# Patient Record
Sex: Female | Born: 1980 | Race: Black or African American | Hispanic: No | Marital: Single | Smoking: Former smoker
Health system: Southern US, Community
[De-identification: ages and names within clinical notes are randomized; demographics above are authoritative.]

## PROBLEM LIST (undated history)

## (undated) DIAGNOSIS — I10 Essential (primary) hypertension: Secondary | ICD-10-CM

## (undated) DIAGNOSIS — N760 Acute vaginitis: Secondary | ICD-10-CM

## (undated) DIAGNOSIS — B9689 Other specified bacterial agents as the cause of diseases classified elsewhere: Secondary | ICD-10-CM

## (undated) HISTORY — DX: Other specified bacterial agents as the cause of diseases classified elsewhere: N76.0

## (undated) HISTORY — PX: TOOTH EXTRACTION: SUR596

## (undated) HISTORY — PX: BREAST LUMPECTOMY: SHX2

## (undated) HISTORY — DX: Essential (primary) hypertension: I10

## (undated) HISTORY — DX: Other specified bacterial agents as the cause of diseases classified elsewhere: B96.89

---

## 2009-12-22 ENCOUNTER — Other Ambulatory Visit: Admission: RE | Admit: 2009-12-22 | Discharge: 2009-12-22 | Payer: Self-pay | Admitting: Obstetrics & Gynecology

## 2009-12-22 ENCOUNTER — Ambulatory Visit: Payer: Self-pay | Admitting: Obstetrics & Gynecology

## 2009-12-23 ENCOUNTER — Encounter: Payer: Self-pay | Admitting: Obstetrics & Gynecology

## 2009-12-23 LAB — CONVERTED CEMR LAB
HCV Ab: NEGATIVE
Hepatitis B Surface Ag: NEGATIVE
Trich, Wet Prep: NONE SEEN
WBC, Wet Prep HPF POC: NONE SEEN

## 2010-08-24 ENCOUNTER — Ambulatory Visit: Payer: Self-pay | Admitting: Obstetrics & Gynecology

## 2010-09-08 ENCOUNTER — Ambulatory Visit: Payer: Medicaid Other | Admitting: Obstetrics & Gynecology

## 2010-09-08 DIAGNOSIS — Z3041 Encounter for surveillance of contraceptive pills: Secondary | ICD-10-CM

## 2010-10-12 NOTE — Assessment & Plan Note (Signed)
Molly Smith, Molly Smith           ACCOUNT NO.:  [id number]   MEDICAL RECORD NO.:  [id number]          PATIENT TYPE:  POB   LOCATION:  [facility] at [place]         FACILITY:  [facility]   PHYSICIAN:  Jaynie Collins, MD     DATE OF BIRTH:  1981/01/15   DATE OF SERVICE:  12/22/2009                                  CLINIC NOTE   REASON FOR VISIT:  Annual examination.   A 30 year old gravida 2, para 1-0-1-1, who is here for her annual  examination.  The patient also reports that she has had midcycle  breakthrough bleeding for the past 3 months.  She is currently on Ortho  Tri-Cyclen Lo and has been on this for the last year and a half, but  just started having this breakthrough bleeding currently midcycle.  The  patient is not pregnant and has no other concerns.  She does have mild  amount of pain when she is having the breakthrough bleeding, but  otherwise no other complaints of pelvic pain.  The patient is sexually  active and does not have any problems.  Her last menstrual period was  November 23, 2009, and her last Pap smear was in January 2010.   PAST OB/GYN HISTORY:  The patient has had 1 vaginal delivery and 1  termination.  Her menstrual history is remarkable for menarche at age  27, usually regular periods on Ortho Tri-Cyclen Lo.  The patient has had  normal Pap smears and no sexually transmitted infections.  The patient  does report having recurrent episodes of bacterial vaginosis and does  take Flagyl as needed.   PAST MEDICAL HISTORY:  Recurrent bacterial vaginosis.   PAST SURGICAL HISTORY:  Left breast lumpectomy which was benign.   MEDICATIONS:  1. Ortho Tri-Cyclen Lo.  2. Flagyl 500 b.i.d. as needed.  3. Diflucan 150 mg p.r.n.   ALLERGIES:  PENICILLIN.   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  She  lives with her mother and daughter.  She smokes 1 pack of cigarettes per  week and has  smoked for 8 years.  She rarely drinks any alcohol.  She  denies any illicit drug use or intravenous drug use.  Denies any past or  current history of physical or sexual abuse.   FAMILY HISTORY:  Only remarkable for high blood pressure.  She denies  any gynecologic cancers, breast cancer, or colon cancer history.   REVIEW OF SYSTEMS:  Remarkable for a 10-pound weight gain, dysmenorrhea,  and vaginal discharge.   PHYSICAL EXAMINATION:  VITAL SIGNS:  Pulse is 88, blood pressure 119/78,  weight 148 pounds, height 66 inches.  GENERAL:  No apparent distress.  HEENT:  Normocephalic, atraumatic.  NECK:  Supple.  No masses.  Normal thyroid.  LUNGS:  Clear to auscultation bilaterally.  HEART:  Regular rate and rhythm.  BREASTS:  Symmetric in size, soft, nontender.  No abnormal masses, skin  changes, nipple drainage, or lymphadenopathy.  ABDOMEN:  Soft, nontender, nondistended.  No organomegaly.  EXTREMITIES:  No cyanosis, clubbing, or edema.  PELVIC:  Normal external female genitalia.  Pink, well-rugated vagina.  No lesions seen.  The patient does have small amount of blood in her  vaginal vault and is at the start of her menstrual period.  A sample of  her vaginal discharge was taken for wet prep and Pap smear was done.  Bimanual exam showed small mobile uterus, normal adnexa bilaterally.  No  tenderness on palpation.   ASSESSMENT AND PLAN:  The patient is a 30 year old gravida 2, para 1-0-1-  1, here for annual examination.  The patient also complains of  breakthrough bleeding in the midcycle for the past 3 months and is  currently on Ortho Tri-Cyclen Lo.  As for her preventative health  maintenance, she had a normal breast examination today and Pap smear is  pending.  We will follow up on the results.  The patient was interested  in sexually transmitted infection screen which was also done today and  the results will be communicated to the patient.  As for her  breakthrough bleeding  midcycle, the patient was told that this is  mittelschmerz phenomenon and this is midcycle ovulation pain which can  be also associated with some bleeding.  She was told that given that she  is on a triphasic pill and it is low triphasic pill that she could be  having breakthrough ovulation.  It is recommended that she goes on a  monophasic pill, the patient does want to remain on low pills, so she  was given Loestrin 24 Fe.  She was told that if she continues to have  breakthrough bleeding in this low-dose monophasic pill, she might have  to go to a regular 30 or 35 mcg monophasic pill and she agrees with  this.  She was given samples for 2 months of Loestrin 24.  We will see  what her response to this.  She was also given a script for Loestrin for  1 year.  The patient was told to call back if her symptoms continue or  for any other gynecologic symptoms.           ______________________________  Jaynie Collins, MD     UA/MEDQ  D:  12/22/2009  T:  12/23/2009  Job:  [id number]

## 2011-01-26 ENCOUNTER — Encounter: Payer: Self-pay | Admitting: *Deleted

## 2011-01-26 ENCOUNTER — Ambulatory Visit (INDEPENDENT_AMBULATORY_CARE_PROVIDER_SITE_OTHER): Payer: Medicaid Other | Admitting: Obstetrics & Gynecology

## 2011-01-26 ENCOUNTER — Encounter: Payer: Self-pay | Admitting: Obstetrics & Gynecology

## 2011-01-26 ENCOUNTER — Other Ambulatory Visit (HOSPITAL_COMMUNITY)
Admission: RE | Admit: 2011-01-26 | Discharge: 2011-01-26 | Disposition: A | Discharge status: Home / Self Care | Payer: Medicaid Other | Source: Ambulatory Visit | Attending: Obstetrics & Gynecology | Admitting: Obstetrics & Gynecology

## 2011-01-26 VITALS — BP 120/80 | HR 92 | Ht 66.0 in | Wt 153.0 lb

## 2011-01-26 DIAGNOSIS — Z01419 Encounter for gynecological examination (general) (routine) without abnormal findings: Secondary | ICD-10-CM

## 2011-01-26 DIAGNOSIS — Z3041 Encounter for surveillance of contraceptive pills: Secondary | ICD-10-CM

## 2011-01-26 DIAGNOSIS — Z7251 High risk heterosexual behavior: Secondary | ICD-10-CM

## 2011-01-26 DIAGNOSIS — Z1159 Encounter for screening for other viral diseases: Secondary | ICD-10-CM | POA: Insufficient documentation

## 2011-01-26 DIAGNOSIS — B373 Candidiasis of vulva and vagina: Secondary | ICD-10-CM

## 2011-01-26 DIAGNOSIS — N63 Unspecified lump in unspecified breast: Secondary | ICD-10-CM

## 2011-01-26 DIAGNOSIS — Z113 Encounter for screening for infections with a predominantly sexual mode of transmission: Secondary | ICD-10-CM | POA: Insufficient documentation

## 2011-01-26 DIAGNOSIS — N760 Acute vaginitis: Secondary | ICD-10-CM

## 2011-01-26 MED ORDER — FLUCONAZOLE 150 MG PO TABS: 150.0000 mg | ORAL_TABLET | ORAL | Status: DC | PRN

## 2011-01-26 MED ORDER — METRONIDAZOLE 500 MG PO TABS: 500.0000 mg | ORAL_TABLET | Freq: Two times a day (BID) | ORAL | Status: DC | PRN

## 2011-01-26 MED ORDER — METRONIDAZOLE 0.75 % EX GEL: Freq: Two times a day (BID) | CUTANEOUS | Status: AC

## 2011-01-26 NOTE — Progress Notes (Signed)
Subjective:    Patient ID: Laura-Lee Villegas, female    DOB: Jan 17, 1981, 30 y.o.   MRN: [id number]  HPI    Review of Systems     Objective:   Physical Exam        Assessment & Plan:   Subjective:    Jerusha Reising is a 30 y.o. female who presents for an annual exam. The patient has no complaints today. The patient is sexually active. GYN screening history: last pap: was normal. The patient wears seatbelts: yes. The patient participates in regular exercise: yes. Has the patient ever been transfused or tattooed?: yes. The patient reports that there is not domestic violence in her life. She complains that she has been experiencing increased hair loss recently and blames her Necon.  She said the same thing occurred when she used the Mirena.  She also requests refills on all her meds for her recurrent BV and yeast.  Also requesting STI testing.  Menstrual History: OB History    Grav Para Term Preterm Abortions TAB SAB Ect Mult Living   2 1   1  1   1       Menarche age: 50 Patient's last menstrual period was 01/18/2011.    The following portions of the patient's history were reviewed and updated as appropriate: allergies, current medications, past family history, past medical history, past social history, past surgical history and problem list.  Review of Systems A comprehensive review of systems was negative.    Objective:    BP 120/80  Pulse 92  Ht 5\' 6"  (1.676 m)  Wt 153 lb (69.4 kg)  BMI 24.69 kg/m2  LMP 01/18/2011  General Appearance:    Alert, cooperative, no distress, appears stated age  Head:    Normocephalic, without obvious abnormality, atraumatic  Eyes:    PERRL, conjunctiva/corneas clear, EOM's intact, fundi    benign, both eyes  Ears:    Normal TM's and external ear canals, both ears  Nose:   Nares normal, septum midline, mucosa normal, no drainage    or sinus tenderness  Throat:   Lips, mucosa, and tongue normal; teeth and gums normal  Neck:   Supple,  symmetrical, trachea midline, no adenopathy;    thyroid:  no enlargement/tenderness/nodules; no carotid   bruit or JVD  Back:     Symmetric, no curvature, ROM normal, no CVA tenderness  Lungs:     Clear to auscultation bilaterally, respirations unlabored  Chest Wall:    No tenderness or deformity   Heart:    Regular rate and rhythm, S1 and S2 normal, no murmur, rub   or gallop  Breast Exam:    2 mobile, discrete masses in left breast upper outer quadrant, she says they have been there for years but Dr. Mat Carne exam last year doesn't mention it.  Abdomen:    Soft, non-tender, bowel sounds active all four quadrants,    no masses, no organomegaly  Genitalia:    Normal female without lesion, discharge or tenderness  Rectal:      Extremities:   Extremities normal, atraumatic, no cyanosis or edema  Pulses:   2+ and symmetric all extremities  Skin:   Skin color, texture, turgor normal, no rashes or lesions  Lymph nodes:   Cervical, supraclavicular, and axillary nodes normal  Neurologic:   CNII-XII intact, normal strength, sensation and reflexes    throughout  .    Assessment:    Healthy female exam.  Left breast masses   Plan:  Pap smear.  Schedule diagnostic mammogram and general surgery consult.

## 2011-01-27 LAB — HEPATITIS B SURFACE ANTIGEN: Hepatitis B Surface Ag: NEGATIVE

## 2011-01-28 NOTE — Progress Notes (Signed)
[facility] called and stated that the original order for the sterotype would not be done until the Lt breast u/s was done first and new orders needed to be placed in the system.

## 2011-02-15 ENCOUNTER — Encounter: Payer: Self-pay | Admitting: Obstetrics & Gynecology

## 2011-02-15 ENCOUNTER — Ambulatory Visit (INDEPENDENT_AMBULATORY_CARE_PROVIDER_SITE_OTHER): Payer: Medicaid Other | Admitting: Obstetrics & Gynecology

## 2011-02-15 VITALS — BP 127/70 | HR 91 | Temp 98.4°F | Resp 16 | Ht 66.0 in | Wt 156.0 lb

## 2011-02-15 DIAGNOSIS — IMO0001 Reserved for inherently not codable concepts without codable children: Secondary | ICD-10-CM

## 2011-02-15 DIAGNOSIS — Z309 Encounter for contraceptive management, unspecified: Secondary | ICD-10-CM

## 2011-02-15 MED ORDER — DROSPIREN-ETH ESTRAD-LEVOMEFOL 3-0.03-0.451 MG PO TABS: 1.0000 | ORAL_TABLET | Freq: Every day | ORAL | Status: DC

## 2011-02-15 NOTE — Progress Notes (Signed)
  Subjective:    Patient ID: Molly Smith, female    DOB: [birth year], 30 y.o.   MRN: [id number]  HPI She is here to discuss changing her OCPS.  She has tried several types of OCPs as well as Mirena.  With her current OCP and with Mirena, she complains of hair loss. (TSH was normal). With Ortho Tricyclen- Lo she complained of BTB.  She would like to try another brand.   Review of Systems She also would like to have the prescription for Diflucan changed to 3 pills per order.    Objective:   Physical Exam        Assessment & Plan:  OCP selection- I have given her 2 samples of Safryl and will e-prescribe it. With regards to the request for changing the Diflucan prescription, I have declines.  I told her that studies show that patients are not always correct in their self-diagnosis of yeast infections.  I told her that if one Diflucan doesn't work, she should come in for a wet prep to confirm the correct diagnosis.

## 2011-02-15 NOTE — Progress Notes (Signed)
Addended by: Allie Bossier on: 02/15/2011 02:37 PM   Modules accepted: Orders

## 2011-05-06 ENCOUNTER — Encounter: Payer: Self-pay | Admitting: Obstetrics and Gynecology

## 2011-05-06 ENCOUNTER — Ambulatory Visit (INDEPENDENT_AMBULATORY_CARE_PROVIDER_SITE_OTHER): Payer: Medicaid Other | Admitting: Obstetrics and Gynecology

## 2011-05-06 VITALS — BP 106/63 | HR 86 | Temp 98.2°F | Resp 16 | Ht 66.0 in | Wt 154.0 lb

## 2011-05-06 DIAGNOSIS — N921 Excessive and frequent menstruation with irregular cycle: Secondary | ICD-10-CM

## 2011-05-06 MED ORDER — IBUPROFEN 600 MG PO TABS: 600.0000 mg | ORAL_TABLET | Freq: Four times a day (QID) | ORAL | Status: AC | PRN

## 2011-05-06 MED ORDER — DROSPIREN-ETH ESTRAD-LEVOMEFOL 3-0.03-0.451 MG PO TABS: 1.0000 | ORAL_TABLET | Freq: Every day | ORAL | Status: DC

## 2011-05-06 NOTE — Progress Notes (Addendum)
Earlyne Iba [id number] @Unknown  Chief Complaint  Patient presents with  . Gynecologic Exam    irregular bleeding on BC    SUBJECTIVE  HPI: Bleeding and cramping x 2 wks and taking Alese without missing any pills. She was seen here 02/09/2011 by Dr. Marice Potter few prescribed Safryl OCPs in hopes of alleviating her perceived hair loss while on Alese. However after taking 2 weeks of Safryl she started back on day one of her pack of Alese (due to BTB). She is now beginning wk 3 of that pack.  She has tried various OCP formulations in the past as well as Mirena and Depo. The only thing that was acceptable to her in terms of her bleeding pattern was continuous Alese (without taking the sugar pills).   Past Medical History  Diagnosis Date  . Hypertension   . Bacterial vaginosis     recurrent   Past Surgical History  Procedure Date  . Breast lumpectomy     lt breast  benign   History   Social History  . Marital Status: Single    Spouse Name: N/A    Number of Children: N/A  . Years of Education: N/A   Occupational History  . mental health    Social History Main Topics  . Smoking status: Current Everyday Smoker -- 0.3 packs/day for 8 years  . Smokeless tobacco: Never Used  . Alcohol Use: No     rarely  . Drug Use: No  . Sexually Active: Yes -- Female partner(s)   Other Topics Concern  . Not on file   Social History Narrative  . No narrative on file   Current Outpatient Prescriptions on File Prior to Visit  Medication Sig Dispense Refill  . fluconazole (DIFLUCAN) 150 MG tablet Take 1 tablet (150 mg total) by mouth as needed.  1 tablet  6  . fluticasone (FLONASE) 50 MCG/ACT nasal spray Place 1 spray into the nose daily.        . metroNIDAZOLE (METROGEL) 0.75 % gel Apply topically 2 (two) times daily.  45 g  6  . norethindrone-ethinyl estradiol 1/35 (ORTHO-NOVUM, NORTREL,CYCLAFEM) tablet Take 1 tablet by mouth daily.        . Drospirenone-Ethinyl Estradiol-Levomefol (SAFYRAL)  3-0.03-0.451 MG tablet Take 1 tablet by mouth daily.  1 Package  11  . metroNIDAZOLE (FLAGYL) 500 MG tablet Take 1 tablet (500 mg total) by mouth 2 (two) times daily as needed.  20 tablet  6   Allergies  Allergen Reactions  . Penicillins Rash    ROS: Pertinent items in HPI  OBJECTIVE  BP 106/63  Pulse 86  Temp(Src) 98.2 F (36.8 C) (Oral)  Resp 16  Ht 5\' 6"  (1.676 m)  Wt 154 lb (69.854 kg)  BMI 24.86 kg/m2  LMP 04/15/2011  Exam deferred  ASSESSMENT  Breakthrough bleeding on OCPs   PLAN After long discussion with patient she understands that she needs to stick with a particular formulation for at least 3-6 months to minimize the breakthrough bleeding. She has decided that she will try taking the Safryl by wants to take it on a continuous basis since she had an acceptable bleeding pattern while on continuous Alese Advised her to finish out this pack including the inactive pills and then start continuous Alese. We'll see her back in 3 months just to see how she's doing and to give her further instructions on taking the OCPs. She is instructed to keep the bleeding calendar tube and bring it with her  to the next appointment.

## 2011-05-06 NOTE — Progress Notes (Signed)
Addended by: Caren Griffins C on: 05/06/2011 11:44 AM   Modules accepted: Orders

## 2011-05-06 NOTE — Patient Instructions (Signed)
Oral Contraceptives Oral contraceptives (OCs) are medicines taken to prevent pregnancy. They are the most widely used method of birth control. OCs work by preventing the ovaries from releasing eggs. The OC hormones also cause the mucus on the cervix to thicken, preventing the sperm from entering the uterus. They also cause the lining of the uterus to become thin, not allowing a fertilized egg to attach to the inside of the uterus. OCs have a failure rate of less than 1%, when taken exactly as prescribed. THERE ARE 2 TYPES OF OC  OC that contains a mix of estrogen and progesterone hormones is the most common OC used. It is taken for 21 days, followed by 7 days of not taking the OC hormones. It can be packaged as 28 pills, with the last 7 pills being inactive. You take a pill every day. This way you do not need to remember when to restart taking the active pills. Most women will begin their menstrual period 2 to 3 days after taking the hormone pill. The menstrual period is usually lighter and shorter. This combination OC should not be taken if you are breast-feeding.   The progesterone only (minipill) OC does not contain estrogen. It is taken every day, continuously. You may have only spotting for a period, or no period at all. The progesterone only OC can be taken if you are breast-feeding your baby.  OCs come in:  Packs of 21 pills, with no pills to take for 7 days after the last pill.   Packs of 28 pills, with a pill to take every day. The last 7 pills are without hormones.   Packs of 91 pills (continuous or extended use), with a pill to take every day. The first 84 pills contain the hormones, and the last 7 pills do not. That is when you will have your menstrual period. You will not have a menstrual period during the time you are taking the first 84 pills.  HOW TO TAKE OC Your caregiver may advise you on how to start taking the first cycle of OCs. Otherwise, you can:  Start on day 1 or day 5 of  your menstrual period, taking the first pack of the OC. You will not need any backup contraceptive protection with this start time.   Start on the first Sunday after your menstrual period, day 7 of your menstrual period, or the day you get your prescription. In these cases, you will need backup contraceptive protection for the first cycle.  No matter which day you start the OC, you will always start a new pack on that same day of the week. It is a good idea to have an extra pack of OCs and a backup contraceptive method available, in case you miss some pills or lose your OC pack. COMMON REASONS FOR FAILURE   Forgetting to take the pill at the same time every day.   Poor absorption of the pill from the stomach into the bloodstream. This can be caused by diarrhea, vomiting, and the use of some medicines that kill germs (antibiotics).   Stomach or intestinal disease.   Taking OCs with other medicines that may make them less effective (carbamazepine, phenytoin, phenobarbital, rifampin).   Using OCs that have passed their expiration dates.   Forgetting to restart the pills on day 7, when using the packs of 21 pills.  If you forget to take 1 pill, take it as soon as you remember, and take the next pill at the  regular time. If you miss 2 or more pills, use backup birth control until your next menstrual period starts. Also, you may have vaginal spotting or bleeding when you miss 2 or more OC pills. If you use the pack of 28 pills or 91 pills, and you miss 1 of the last 7 pills (pills with no hormones), it will not matter. Just throw away the rest of the non-hormone pills and start a new 28 or 91 pill pack. COMMON USES OF OC  Decreasing premenstrual problems (symptoms).   Treating menstrual period cramps.   Avoiding becoming pregnant.   Regulating the menstrual cycle.   Treating acne.   Decreasing the heavy menstrual flow.   Treating dysfunctional (abnormal) uterine bleeding.   Treating  chronic pelvic pain.   Treating polycystic ovary syndrome (ovary does not ovulate and produces tiny cysts).   Treating endometriosis (uterus lining growing in the pelvis, tubes, and ovaries).   Can be used for emergency contraception.  OCs DO NOT prevent sexually transmitted diseases (STDs). Safer sex practices, such as using condoms along with the pill, can help prevent STDs.  BENEFITS  OC reduces the risk of:   Cancer of the ovary and uterus.   Ovarian cysts.   Pelvic infection.   Symptoms of polycystic ovary syndrome.   Loss of bone (osteoporosis).   Noncancerous (benign) breast disease (fibrocystic breast changes).   Lack of red blood cells (anemia) from heavy or long menstrual periods.   Pregnancy occurring outside the uterus (tubal pregnancy).   Acne.   Slows down the flow of heavy menstrual periods.   Sometimes helps control premenstrual syndrome (PMS).   Stops menstrual cramps and pain.   Controls irregular menstrual periods.   Can be used as emergency contraception.  YOU SHOULD NOT TAKE THE PILL IF YOU:  Are pregnant, or are trying to get pregnant.   Have unexplained or abnormal vaginal bleeding.   Have a history of liver disease, stroke, or heart attack.   Smoke.   Have a history of blood clots, cancer, or heart problems.   Have gallbladder disease.   Have breast cancer or suspect breast cancer.   Have or suspect pelvic cancer.   Have high blood pressure.   Have high cholesterol or high triglycerides.   Have mental depression.   Are breast-feeding, except for the progesterone only OC, with approval of your caregiver.   Have diabetes with kidney, eye, or other blood vessel complications. Or if you have diabetes for 20 years or more.   Have heart valve disease.   Have migraine headaches. They may get worse.  Before taking the pill, a woman will have a physical exam and Pap test. Your caregiver may order blood tests to check blood sugar and  cholesterol levels, and other blood tests that may be necessary. SIDE EFFECTS OF THE PILL MAY INCLUDE:  Breast tenderness, pain and discharge.   Change in sex drive (increased or decreased libido).   Depression.   Being tired often.   Headaches.   Anxiety.   Irregular spotting or vaginal bleeding for a couple of months.   Leg pain.   Cramps, or swelling of your limbs (extremities).   Mood swings.   Weight loss or weight gain.   Feeling sick to your stomach (nausea).   Change in appetite (hunger).   Loss of hair.   Yeast or fungus vaginal infection.   Nervousness.   Rash.   Acne.   No menstrual period (amenorrhea).  When  starting an OC, it is usually best to allow 2-3 months, if possible, for the body to adjust (before stopping because of side effects). This allows for adjustment to the changes in hormone levels. If a woman continues to have side effects, it may be possible to change to a different OC. It is important to discuss side effects with your caregiver. Often, changing to a different pill causes the side effects to subside. RISKS AND COMPLICATIONS   Blood clots of the leg, heart, lung, or brain.   High blood pressure.   Gallbladder disease.   Liver tumors.   Brain bleeding (hemorrhage).   Slight risk of breast cancer.  HOME CARE INSTRUCTIONS   Do not smoke.   Only take over-the-counter or prescription medicines for pain, discomfort, fever, or breast tenderness as directed by your caregiver.   Always use a condom to protect against sexually transmitted disease. OCs do not protect against STDs.   Keep a calendar, marking your menstrual period days.  Recommendations, types, and dosages of OC use change continually. Discuss your choices with your caregiver, and decide what is best for you. There are always exceptions to guidelines. You should always read the information that comes with the OC, and check whether there are any new recommendations or  guidelines. SEEK MEDICAL CARE IF:   You develop nausea and vomiting from the OC.   You have abnormal vaginal discharge.   You need treatment for headaches.   You develop a rash.   You miss your menstrual period.   You develop abnormal vaginal bleeding.   You are losing your hair.   You need treatment for mood swings or depression.   You get dizzy when taking the OC.   You develop acne from taking the OC.  SEEK IMMEDIATE MEDICAL CARE IF:   You develop leg pain.   You develop chest pain.   You develop shortness of breath.   You develop abdominal pain.   You have an uncontrolled headache.   You develop numbness or slurred speech.   You develop visual problems (loss of vision, double, or blurry vision).   You develop heavy vaginal bleeding.  If you are taking the pill, STOP RIGHT AWAY and CALL YOUR CAREGIVER IMMEDIATELY if the following occur:  You develop chest pain and shortness of breath.   You develop pain, redness, and swelling in the legs.   You develop severe headaches, visual changes, or belly (abdominal) pain.   You develop severe depression.   You become pregnant.  Document Released: 08/06/2002 Document Revised: 06/18/2010 Document Reviewed: 05/28/2009 [facility] Patient Information 2012 [place], [place].

## 2011-06-08 ENCOUNTER — Encounter: Payer: Self-pay | Admitting: *Deleted

## 2012-02-16 ENCOUNTER — Encounter: Payer: Self-pay | Admitting: Obstetrics & Gynecology

## 2012-02-16 ENCOUNTER — Ambulatory Visit (INDEPENDENT_AMBULATORY_CARE_PROVIDER_SITE_OTHER): Payer: BC Managed Care – PPO | Admitting: Obstetrics & Gynecology

## 2012-02-16 VITALS — BP 138/93 | HR 98 | Ht 66.0 in | Wt 159.0 lb

## 2012-02-16 DIAGNOSIS — N760 Acute vaginitis: Secondary | ICD-10-CM

## 2012-02-16 DIAGNOSIS — N63 Unspecified lump in unspecified breast: Secondary | ICD-10-CM

## 2012-02-16 DIAGNOSIS — N6321 Unspecified lump in the left breast, upper outer quadrant: Secondary | ICD-10-CM | POA: Insufficient documentation

## 2012-02-16 DIAGNOSIS — Z113 Encounter for screening for infections with a predominantly sexual mode of transmission: Secondary | ICD-10-CM

## 2012-02-16 DIAGNOSIS — Z01419 Encounter for gynecological examination (general) (routine) without abnormal findings: Secondary | ICD-10-CM

## 2012-02-16 MED ORDER — LEVONORGESTREL-ETHINYL ESTRAD 0.1-20 MG-MCG PO TABS: ORAL_TABLET | ORAL | Status: DC

## 2012-02-16 MED ORDER — IBUPROFEN 600 MG PO TABS: 600.0000 mg | ORAL_TABLET | Freq: Four times a day (QID) | ORAL | Status: DC | PRN

## 2012-02-16 NOTE — Patient Instructions (Signed)
Place premenopausal annual exam patient instructions here.  °

## 2012-02-16 NOTE — Progress Notes (Signed)
  Subjective:     Molly Smith is a 31 y.o. female here for a routine exam.  Current complaints: pain on ribs under left breast, left breast lump, vaginal discharge, would like to do continuous OCPs .  Personal health questionnaire reviewed: yes.   Gynecologic History Patient's last menstrual period was 01/25/2012. Contraception: OCP (estrogen/progesterone) Last Pap: 2012. Results were: normal and negative co testing  Obstetric History OB History    Grav Para Term Preterm Abortions TAB SAB Ect Mult Living   2 1   1  1   1      # Outc Date GA Lbr Len/2nd Wgt Sex Del Anes PTL Lv   1 PAR            2 SAB                The following portions of the patient's history were reviewed and updated as appropriate: allergies, current medications, past family history, past medical history, past social history, past surgical history and problem list.  Review of Systems Pertinent items are noted in HPI.    Objective:    Vitals:  WNL General appearance: alert, cooperative and no distress Head: Normocephalic, without obvious abnormality, atraumatic Eyes: negative Throat: lips, mucosa, and tongue normal; teeth and gums normal Lungs: clear to auscultation bilaterally Breasts: normal appearance, Mass in left breast about 2 o'clock out breast, No nipple retraction or dimpling, No nipple discharge or bleeding Heart: regular rate and rhythm Abdomen: soft, non-tender; bowel sounds normal; no masses,  no organomegaly; pain on left ribs c/s costochondritis Pelvic: cervix normal in appearance, external genitalia normal, no adnexal masses or tenderness, no bladder tenderness, no cervical motion tenderness, perianal skin: no external genital warts noted, rectovaginal septum normal, urethra without abnormality or discharge, uterus normal size, shape, and consistency and vagina normal with white discharge Extremities: no edema, redness or tenderness in the calves or thighs Skin: no lesions or  rash Lymph nodes: Axillary adenopathy: none       Assessment:    Healthy female exam.   Left breast lump Vaginal discharge Plan:    Smoking cessation--[place] quit line; smokes 3 cigs a day No pap this year, next due 2015 Costochondritis--Motrin 600 mg q 6 hours Vaginal discahrge--BD Affirm STD testing--sexually transmitted disease--GC, Chlam, HIV, Hep B & C, RPR Borderline BP, needs to come back in for rpt BP Will call with BD affirm results [place]esse continuous OCPs

## 2012-02-16 NOTE — Progress Notes (Signed)
Patient is here for yearly exam, interested in having her iron checked, she has been anemic in the past and seems to be having some of the same symptoms.

## 2012-02-17 ENCOUNTER — Other Ambulatory Visit: Payer: Self-pay | Admitting: Obstetrics & Gynecology

## 2012-02-17 DIAGNOSIS — N6321 Unspecified lump in the left breast, upper outer quadrant: Secondary | ICD-10-CM

## 2012-02-17 LAB — HEPATITIS C ANTIBODY: HCV Ab: NEGATIVE

## 2012-02-17 LAB — RPR

## 2012-02-17 LAB — HEPATITIS B SURFACE ANTIGEN: Hepatitis B Surface Ag: NEGATIVE

## 2012-02-17 LAB — GC/CHLAMYDIA PROBE AMP, GENITAL: Chlamydia, DNA Probe: NEGATIVE

## 2012-02-20 ENCOUNTER — Other Ambulatory Visit: Payer: Self-pay | Admitting: *Deleted

## 2012-02-20 ENCOUNTER — Telehealth: Payer: Self-pay | Admitting: *Deleted

## 2012-02-20 DIAGNOSIS — B9689 Other specified bacterial agents as the cause of diseases classified elsewhere: Secondary | ICD-10-CM

## 2012-02-20 DIAGNOSIS — B373 Candidiasis of vulva and vagina: Secondary | ICD-10-CM

## 2012-02-20 DIAGNOSIS — N760 Acute vaginitis: Secondary | ICD-10-CM

## 2012-02-20 MED ORDER — FLUCONAZOLE 150 MG PO TABS: 150.0000 mg | ORAL_TABLET | ORAL | Status: DC | PRN

## 2012-02-20 MED ORDER — METRONIDAZOLE 500 MG PO TABS: 500.0000 mg | ORAL_TABLET | Freq: Two times a day (BID) | ORAL | Status: DC | PRN

## 2012-02-20 NOTE — Telephone Encounter (Signed)
Message copied by Granville Lewis on Mon Feb 20, 2012  3:05 PM ------      Message from: Lesly Dukes      Created: Mon Feb 20, 2012  2:54 PM       Call pts with results, please

## 2012-02-20 NOTE — Telephone Encounter (Signed)
Pt notified via phone of neg STD testing.

## 2012-02-24 ENCOUNTER — Other Ambulatory Visit: Payer: Self-pay | Admitting: Obstetrics & Gynecology

## 2012-02-24 ENCOUNTER — Ambulatory Visit
Admission: RE | Admit: 2012-02-24 | Discharge: 2012-02-24 | Disposition: A | Discharge status: Home / Self Care | Payer: BC Managed Care – PPO | Source: Ambulatory Visit | Attending: Obstetrics & Gynecology | Admitting: Obstetrics & Gynecology

## 2012-02-24 DIAGNOSIS — N6321 Unspecified lump in the left breast, upper outer quadrant: Secondary | ICD-10-CM

## 2012-02-25 ENCOUNTER — Encounter: Payer: Self-pay | Admitting: Obstetrics & Gynecology

## 2012-02-26 ENCOUNTER — Other Ambulatory Visit: Payer: Self-pay | Admitting: Obstetrics & Gynecology

## 2012-03-14 ENCOUNTER — Encounter: Payer: BC Managed Care – PPO | Admitting: *Deleted

## 2012-03-27 ENCOUNTER — Ambulatory Visit (INDEPENDENT_AMBULATORY_CARE_PROVIDER_SITE_OTHER): Payer: BC Managed Care – PPO | Admitting: *Deleted

## 2012-03-27 VITALS — BP 125/83 | HR 90 | Temp 98.4°F | Resp 16 | Ht 64.0 in | Wt 158.0 lb

## 2012-03-27 DIAGNOSIS — I10 Essential (primary) hypertension: Secondary | ICD-10-CM

## 2012-09-19 ENCOUNTER — Telehealth: Payer: Self-pay | Admitting: *Deleted

## 2012-09-19 NOTE — Telephone Encounter (Signed)
Pt called and missed 1 OCP last week then doubled up the next day.  She had started to spott now a full flow bleed.  Recommended stopping pill pack and restarting a new pack on Sun.  Pt denies having intercourse for months.  If she continues to have bleeding in next pack will call for appt.

## 2012-10-09 ENCOUNTER — Ambulatory Visit (INDEPENDENT_AMBULATORY_CARE_PROVIDER_SITE_OTHER): Payer: BC Managed Care – PPO | Admitting: Family Medicine

## 2012-10-09 ENCOUNTER — Encounter: Payer: Self-pay | Admitting: Family Medicine

## 2012-10-09 VITALS — BP 135/94 | HR 119 | Temp 98.5°F | Resp 16 | Ht 65.0 in | Wt 159.0 lb

## 2012-10-09 DIAGNOSIS — N760 Acute vaginitis: Secondary | ICD-10-CM

## 2012-10-09 DIAGNOSIS — Z91030 Bee allergy status: Secondary | ICD-10-CM

## 2012-10-09 DIAGNOSIS — R8290 Unspecified abnormal findings in urine: Secondary | ICD-10-CM

## 2012-10-09 DIAGNOSIS — Z91038 Other insect allergy status: Secondary | ICD-10-CM

## 2012-10-09 DIAGNOSIS — R82998 Other abnormal findings in urine: Secondary | ICD-10-CM

## 2012-10-09 MED ORDER — EPINEPHRINE 0.3 MG/0.3ML IJ SOAJ: 0.3000 mg | Freq: Once | INTRAMUSCULAR | Status: DC

## 2012-10-09 MED ORDER — FLUCONAZOLE 150 MG PO TABS: 150.0000 mg | ORAL_TABLET | Freq: Once | ORAL | Status: DC

## 2012-10-09 NOTE — Progress Notes (Signed)
  Subjective:    Patient ID: Molly Smith, female    DOB: 01/13/1981, 32 y.o.   MRN: [id number]  Vaginal Discharge The patient's primary symptoms include genital itching and a vaginal discharge. This is a new problem. The current episode started in the past 7 days. The problem has been gradually worsening. She is not pregnant. Pertinent negatives include no abdominal pain, chills, fever, nausea or vomiting. The vaginal discharge was white and thick.   Condom came off during sex 2 wks ago.    Review of Systems  Constitutional: Negative for fever and chills.  Gastrointestinal: Negative for nausea, vomiting and abdominal pain.  Genitourinary: Positive for vaginal discharge.       Objective:   Physical Exam  Vitals reviewed. Constitutional: She appears well-developed and well-nourished.  HENT:  Head: Normocephalic and atraumatic.  Neck: Neck supple.  Cardiovascular: Normal rate.   Pulmonary/Chest: Effort normal.  Abdominal: Soft.  Genitourinary:  NEFG, BUS is WNL, curdlike white discharge noted.  No CMT, no uterine or adnexal tenderness.  Uterus NSSC, no adnexal mass.          Assessment & Plan:

## 2012-10-09 NOTE — Assessment & Plan Note (Signed)
Suspect yeast.  Will treat and check wet prep and GC/Chlam.  Full STD screen.

## 2012-10-09 NOTE — Patient Instructions (Signed)
Vaginitis  Vaginitis is an infection. It causes soreness, swelling, and redness (inflammation) of the vagina. Many of these infections are sexually transmitted diseases (STDs). Having unprotected sex can cause further problems and complications such as:   Chronic pelvic pain.   Infertility.   Unwanted pregnancy.   Abortion.   Tubal pregnancy.   Infection passed on to the newborn.   Cancer.  CAUSES    Monilia. This is a yeast or fungus infection, not an STD.   Bacterial vaginosis. The normal balance of bacteria in the vagina is disrupted and is replaced by an overgrowth of certain bacteria.   Gonorrhea, chlamydia. These are bacterial infections that are STDs.   Vaginal sponges, diaphragms, and intrauterine devices.   Trichomoniasis. This is a STD infection caused by a parasite.   Viruses like herpes and human papillomavirus. Both are STDs.   Pregnancy.   Immunosuppression. This occurs with certain conditions such as HIV infection or cancer.   Using bubble bath.   Taking certain antibiotic medicines.   Sporadic recurrence can occur if you become sick.   Diabetes.   Steroids.   Allergic reaction. If you have an allergy to:   Douches.   Soaps.   Spermicides.   Condoms.   Scented tampons or vaginal sprays.  SYMPTOMS    Abnormal vaginal discharge.   Itching of the vagina.   Pain in the vagina.   Swelling of the vagina.  In some cases, there are no symptoms.  TREATMENT   Treatment will vary depending on the type of infection.   Bacteria or trichomonas are usually treated with oral antibiotics and sometimes vaginal cream or suppositories.   Monilia vaginitis is usually treated with vaginal creams, suppositories, or oral antifungal pills.   Viral vaginitis has no cure. However, the symptoms of herpes (a viral vaginitis) can be treated to relieve the discomfort. Human papillomavirus has no symptoms. However, there are treatments for the diseases caused by human papillomavirus.   With allergic  vaginitis, you need to stop using the product that is causing the problem. Vaginal creams can be used to treat the symptoms.   When treating an STD, the sex partner should also be treated.  HOME CARE INSTRUCTIONS    Take all the medicines as directed by your caregiver.   Do not use scented tampons, soaps, or vaginal sprays.   Do not douche.   Tell your sex partner if you have a vaginal infection or an STD.   Do not have sexual intercourse until you have treated the vaginitis.   Practice safe sex by using condoms.  SEEK MEDICAL CARE IF:    You have abdominal pain.   Your symptoms get worse during treatment.  Document Released: 03/13/2007 Document Revised: 08/08/2011 Document Reviewed: 11/06/2008  Exit[employer] Patient Information 2013 [employer].

## 2012-10-10 ENCOUNTER — Telehealth: Payer: Self-pay | Admitting: *Deleted

## 2012-10-10 LAB — GC/CHLAMYDIA PROBE AMP: GC Probe RNA: NEGATIVE

## 2012-10-10 LAB — HIV ANTIBODY (ROUTINE TESTING W REFLEX): HIV: NONREACTIVE

## 2012-10-10 LAB — RPR

## 2012-10-10 NOTE — Telephone Encounter (Signed)
Message copied by Granville Lewis on Wed Oct 10, 2012 11:46 AM ------      Message from: Reva Bores      Created: Wed Oct 10, 2012 11:43 AM       GC and Clam are also neg. ------

## 2012-10-10 NOTE — Telephone Encounter (Signed)
Pt notified of neg cultures and Blood work neg

## 2012-10-10 NOTE — Telephone Encounter (Signed)
Message copied by Granville Lewis on Wed Oct 10, 2012 11:48 AM ------      Message from: Reva Bores      Created: Wed Oct 10, 2012 11:43 AM       GC and Clam are also neg. ------

## 2012-10-11 LAB — WET PREP BY MOLECULAR PROBE: Trichomonas vaginosis: NEGATIVE

## 2013-01-30 ENCOUNTER — Ambulatory Visit: Payer: BC Managed Care – PPO | Admitting: Obstetrics & Gynecology

## 2013-02-05 ENCOUNTER — Encounter: Payer: Self-pay | Admitting: Obstetrics & Gynecology

## 2013-02-05 ENCOUNTER — Ambulatory Visit (INDEPENDENT_AMBULATORY_CARE_PROVIDER_SITE_OTHER): Payer: BC Managed Care – PPO | Admitting: Obstetrics & Gynecology

## 2013-02-05 VITALS — BP 116/74 | HR 98 | Resp 16 | Wt 167.0 lb

## 2013-02-05 DIAGNOSIS — Z01419 Encounter for gynecological examination (general) (routine) without abnormal findings: Secondary | ICD-10-CM

## 2013-02-05 DIAGNOSIS — R3915 Urgency of urination: Secondary | ICD-10-CM

## 2013-02-05 DIAGNOSIS — N63 Unspecified lump in unspecified breast: Secondary | ICD-10-CM

## 2013-02-05 DIAGNOSIS — N920 Excessive and frequent menstruation with regular cycle: Secondary | ICD-10-CM

## 2013-02-05 DIAGNOSIS — N898 Other specified noninflammatory disorders of vagina: Secondary | ICD-10-CM

## 2013-02-05 MED ORDER — FLUTICASONE PROPIONATE 50 MCG/ACT NA SUSP: 2.0000 | Freq: Every day | NASAL | Status: AC

## 2013-02-05 NOTE — Addendum Note (Signed)
Addended by: Arne Cleveland on: 02/05/2013 04:47 PM   Modules accepted: Orders

## 2013-02-05 NOTE — Progress Notes (Signed)
  Subjective:     Molly Smith is a 32 y.o. female here for a routine exam.  Current complaints: heavy menses, recurrent BV, urge incontinence, right rib pain intermittently (3-4 times a week), wants STD testing.  Personal health questionnaire reviewed: yes.   Gynecologic History Patient's last menstrual period was 01/23/2013. Contraception: OCP (estrogen/progesterone) Last Pap: 2012. Results were: normal and HPV negative Last mammogram: 2013. Results were: abnormal--pt was to get 6,12,24 month [place] but did not go.  Obstetric History OB History  Gravida Para Term Preterm AB SAB TAB Ectopic Multiple Living  2 1   1 1    1     # Outcome Date GA Lbr Len/2nd Weight Sex Delivery Anes PTL Lv  2 SAB           1 PAR                The following portions of the patient's history were reviewed and updated as appropriate: allergies, current medications, past family history, past medical history, past social history, past surgical history and problem list.  Review of Systems Pertinent items are noted in HPI.    Objective:   Filed Vitals:   02/05/13 1541  BP: 116/74  Pulse: 98      Vitals:  WNL General appearance: alert, cooperative and no distress Head: Normocephalic, without obvious abnormality, atraumatic Eyes: negative Throat: lips, mucosa, and tongue normal; teeth and gums normal Lungs: clear to auscultation bilaterally Breasts: normal appearance, no masses or tenderness, No nipple retraction or dimpling, No nipple discharge or bleeding Heart: regular rate and rhythm Abdomen: soft, non-tender; bowel sounds normal; no masses,  no organomegaly Pelvic: cervix normal in appearance, external genitalia normal, no adnexal masses or tenderness, no bladder tenderness, no cervical motion tenderness, perianal skin: no external genital warts noted, urethra without abnormality or discharge, uterus normal size, shape, and consistency and vagina normal with yellowish discharge. Extremities:  no edema, redness or tenderness in the calves or thighs Skin: no lesions or rash Lymph nodes: Axillary adenopathy: none        Assessment:    Healthy female exam.  Menorrhagia, recurrent BC, Urinary urgency, Wants STD testing Needs F/U breast US.   Plan:    Education reviewed: safe sex/STD prevention and self breast exams. Contraception: OCP (estrogen/progesterone). Follow up in: 2 weeks. TV US for menorrhagia BD affirm for vaginal discharge Start Boric Acid capsules 2x week for recurrent BV Breast US Check Urine Cx--if negative then try medicines for urge incont. Pap due in 2015

## 2013-02-06 ENCOUNTER — Other Ambulatory Visit: Payer: Self-pay | Admitting: *Deleted

## 2013-02-06 ENCOUNTER — Other Ambulatory Visit: Payer: Self-pay | Admitting: Obstetrics & Gynecology

## 2013-02-06 ENCOUNTER — Telehealth: Payer: Self-pay | Admitting: *Deleted

## 2013-02-06 DIAGNOSIS — N898 Other specified noninflammatory disorders of vagina: Secondary | ICD-10-CM

## 2013-02-06 DIAGNOSIS — N63 Unspecified lump in unspecified breast: Secondary | ICD-10-CM

## 2013-02-06 DIAGNOSIS — Z139 Encounter for screening, unspecified: Secondary | ICD-10-CM

## 2013-02-06 LAB — RPR

## 2013-02-06 LAB — HEPATITIS B SURFACE ANTIGEN: Hepatitis B Surface Ag: NEGATIVE

## 2013-02-06 LAB — HEPATITIS C ANTIBODY: HCV Ab: NEGATIVE

## 2013-02-06 NOTE — Addendum Note (Signed)
Addended by: Arne Cleveland on: 02/06/2013 04:14 PM   Modules accepted: Orders

## 2013-02-06 NOTE — Telephone Encounter (Signed)
Called pt to adv serum STD screen was negative. She is to call back on Friday if I haven't called her with other test results

## 2013-02-06 NOTE — Telephone Encounter (Signed)
Message copied by Arne Cleveland on Wed Feb 06, 2013  3:03 PM ------      Message from: Lesly Dukes      Created: Wed Feb 06, 2013 11:50 AM       Serum STD screen negative.  Call pt ------

## 2013-02-06 NOTE — Telephone Encounter (Signed)
Called pt to adv GC/Chlam is not on urine culture and will need to come back in our office to leave urine specimen - pt is to come to office tomorrow while she is here to do her TVUS

## 2013-02-07 ENCOUNTER — Ambulatory Visit (INDEPENDENT_AMBULATORY_CARE_PROVIDER_SITE_OTHER): Payer: BC Managed Care – PPO

## 2013-02-07 DIAGNOSIS — R9389 Abnormal findings on diagnostic imaging of other specified body structures: Secondary | ICD-10-CM

## 2013-02-07 DIAGNOSIS — N920 Excessive and frequent menstruation with regular cycle: Secondary | ICD-10-CM

## 2013-02-08 ENCOUNTER — Telehealth: Payer: Self-pay | Admitting: *Deleted

## 2013-02-08 LAB — URINE CULTURE: Colony Count: >=100000

## 2013-02-08 LAB — GC/CHLAMYDIA PROBE AMP
CT Probe RNA: NEGATIVE
GC Probe RNA: NEGATIVE

## 2013-02-08 NOTE — Telephone Encounter (Signed)
Message copied by Arne Cleveland on Fri Feb 08, 2013 11:36 AM ------      Message from: Lesly Dukes      Created: Thu Feb 07, 2013  6:56 PM       Negative wet prep. ------

## 2013-02-08 NOTE — Telephone Encounter (Signed)
Called pt to adv neg wet prep and gc/ct neg

## 2013-02-11 ENCOUNTER — Other Ambulatory Visit: Payer: Self-pay | Admitting: Obstetrics & Gynecology

## 2013-02-11 MED ORDER — NITROFURANTOIN MONOHYD MACRO 100 MG PO CAPS: 100.0000 mg | ORAL_CAPSULE | Freq: Two times a day (BID) | ORAL | Status: DC

## 2013-02-21 ENCOUNTER — Ambulatory Visit: Payer: BC Managed Care – PPO | Admitting: Obstetrics & Gynecology

## 2013-03-05 ENCOUNTER — Encounter: Payer: Self-pay | Admitting: Obstetrics & Gynecology

## 2013-03-05 ENCOUNTER — Ambulatory Visit (INDEPENDENT_AMBULATORY_CARE_PROVIDER_SITE_OTHER): Payer: BC Managed Care – PPO | Admitting: Obstetrics & Gynecology

## 2013-03-05 VITALS — BP 128/91 | HR 67 | Resp 16 | Ht 66.0 in | Wt 167.0 lb

## 2013-03-05 DIAGNOSIS — N920 Excessive and frequent menstruation with regular cycle: Secondary | ICD-10-CM

## 2013-03-05 DIAGNOSIS — Z01812 Encounter for preprocedural laboratory examination: Secondary | ICD-10-CM

## 2013-03-05 NOTE — Progress Notes (Signed)
  Subjective:    Patient ID: Molly Smith, female    DOB: 28-Sep-1980, 32 y.o.   MRN: [id number]  HPI  Pt presents for f/u US and menorrhagia.  Pt has heavy menses.  US shows probable polyp.  Had detailed discussion about polyp and its treatment.  Pt would like to have children in the future.  Pt understands need for biopsy today in case there is hyperplasia or malgnancy present.  Pt is on OCPs and still experiencing menorrhagia.  Review of Systems  Constitutional: Negative.   Cardiovascular: Negative.   Gastrointestinal: Negative.   Genitourinary: Positive for menstrual problem.       Objective:   Physical Exam  Vitals reviewed. Constitutional: She appears well-developed and well-nourished.  HENT:  Head: Normocephalic and atraumatic.  Eyes: Conjunctivae are normal.  Pulmonary/Chest: Effort normal.  Abdominal: Soft. She exhibits no distension and no mass. There is no tenderness. There is no rebound and no guarding.  Genitourinary: Vagina normal.  Skin: Skin is warm and dry.  Psychiatric: She has a normal mood and affect.          Assessment & Plan:  32 yo female with menorrhagia, thickened endometrium and probable polyp.  1-Endometrial biopsy today. 2-If not malignancy, will proceed with D&C, polypectomy with truclear.  ENDOMETRIAL BIOPSY     The indications for endometrial biopsy were reviewed.   Risks of the biopsy including cramping, bleeding, infection, uterine perforation, inadequate specimen and need for additional procedures  were discussed. The patient states she understands and agrees to undergo procedure today. Consent was signed. Time out was performed. Urine HCG was negative. A sterile speculum was placed in the patient's vagina and the cervix was prepped with Betadine. A single-toothed tenaculum was placed on the anterior lip of the cervix to stabilize it. The 3 mm pipelle was introduced into the endometrial cavity without difficulty to a depth of 10cm, and a  moderate amount of tissue was obtained and sent to pathology. The instruments were removed from the patient's vagina. Minimal bleeding from the cervix was noted. The patient tolerated the procedure well. Routine post-procedure instructions were given to the patient. The patient will be called with results and surgery scheduled as indicated above.

## 2013-03-13 ENCOUNTER — Other Ambulatory Visit: Payer: BC Managed Care – PPO

## 2013-03-13 ENCOUNTER — Telehealth: Payer: Self-pay | Admitting: Obstetrics & Gynecology

## 2013-03-13 NOTE — Telephone Encounter (Signed)
Telephone call to patient regarding to report negative endometrial biopsy results.  Instructed patient we will notify surgery scheduler to go ahead with scheduling of D&C.  Patient does prefer a Thursday or Friday ASAP for the procedure.

## 2013-03-16 ENCOUNTER — Other Ambulatory Visit: Payer: Self-pay | Admitting: Obstetrics & Gynecology

## 2013-03-18 ENCOUNTER — Telehealth: Payer: Self-pay | Admitting: *Deleted

## 2013-03-18 DIAGNOSIS — IMO0001 Reserved for inherently not codable concepts without codable children: Secondary | ICD-10-CM

## 2013-03-18 MED ORDER — DROSPIREN-ETH ESTRAD-LEVOMEFOL 3-0.03-0.451 MG PO TABS: 1.0000 | ORAL_TABLET | Freq: Every day | ORAL | Status: DC

## 2013-03-18 NOTE — Telephone Encounter (Signed)
Pt called to see when D&C has been scheduled - I adv pt I will check on the schedule and call her back - Pt also wanted to know where the OCP script was sent as she went to both pharmacies where she has rcvd scripts before and they had nothing on file for her - I checked the med list and refill for OCP had not been sent - will send script to [facility] on [address] per pt req.

## 2013-03-19 ENCOUNTER — Encounter (HOSPITAL_COMMUNITY): Payer: Self-pay | Admitting: *Deleted

## 2013-03-19 ENCOUNTER — Other Ambulatory Visit: Payer: Self-pay | Admitting: Obstetrics & Gynecology

## 2013-03-19 ENCOUNTER — Telehealth: Payer: Self-pay | Admitting: *Deleted

## 2013-03-19 ENCOUNTER — Encounter (HOSPITAL_COMMUNITY): Payer: Self-pay | Admitting: Pharmacist

## 2013-03-19 DIAGNOSIS — N920 Excessive and frequent menstruation with regular cycle: Secondary | ICD-10-CM

## 2013-03-19 MED ORDER — MEGESTROL ACETATE 40 MG PO TABS: 40.0000 mg | ORAL_TABLET | Freq: Every day | ORAL | Status: DC

## 2013-03-19 MED ORDER — DROSPIREN-ETH ESTRAD-LEVOMEFOL 3-0.03-0.451 MG PO TABS: 1.0000 | ORAL_TABLET | Freq: Every day | ORAL | Status: DC

## 2013-03-19 NOTE — Telephone Encounter (Signed)
Message copied by Granville Lewis on Tue Mar 19, 2013  9:12 AM ------      Message from: Lesly Dukes      Created: Tue Mar 19, 2013  8:54 AM       I can't find a cbc on [place].  Can you see if seh has one, if not, have her come in for one. ------

## 2013-03-19 NOTE — Telephone Encounter (Signed)
Pt is coming in Thursday for CBC.  She requested that her Megace be sent to [facility] in [place].  She will start the Megace tomorrow.

## 2013-03-19 NOTE — Progress Notes (Signed)
Pt should start Megace at the end of her birthcontrol pack.  RN Chestine Spore to call patient with instructions.

## 2013-03-21 ENCOUNTER — Other Ambulatory Visit (INDEPENDENT_AMBULATORY_CARE_PROVIDER_SITE_OTHER): Payer: BC Managed Care – PPO

## 2013-03-21 DIAGNOSIS — D649 Anemia, unspecified: Secondary | ICD-10-CM

## 2013-03-22 LAB — CBC
Hemoglobin: 10.7 g/dL — ABNORMAL LOW (ref 12.0–15.0)
MCH: 26.4 pg (ref 26.0–34.0)
MCV: 83.5 fL (ref 78.0–100.0)
Platelets: 323 10*3/uL (ref 150–400)
RBC: 4.05 MIL/uL (ref 3.87–5.11)
RDW: 15.7 % — ABNORMAL HIGH (ref 11.5–15.5)
WBC: 3.4 10*3/uL — ABNORMAL LOW (ref 4.0–10.5)

## 2013-03-25 ENCOUNTER — Telehealth: Payer: Self-pay | Admitting: *Deleted

## 2013-03-25 NOTE — Telephone Encounter (Signed)
Pt notified of CBC results.  She is scheduled for surgery on 03/29/13

## 2013-03-26 ENCOUNTER — Other Ambulatory Visit: Payer: BC Managed Care – PPO

## 2013-03-29 ENCOUNTER — Encounter (HOSPITAL_COMMUNITY): Payer: BC Managed Care – PPO | Admitting: Anesthesiology

## 2013-03-29 ENCOUNTER — Ambulatory Visit (HOSPITAL_COMMUNITY)
Admission: RE | Admit: 2013-03-29 | Discharge: 2013-03-29 | Disposition: A | Discharge status: Home / Self Care | Payer: BC Managed Care – PPO | Source: Ambulatory Visit | Attending: Obstetrics & Gynecology | Admitting: Obstetrics & Gynecology

## 2013-03-29 ENCOUNTER — Encounter (HOSPITAL_COMMUNITY): Payer: Self-pay | Admitting: *Deleted

## 2013-03-29 ENCOUNTER — Ambulatory Visit (HOSPITAL_COMMUNITY): Payer: BC Managed Care – PPO | Admitting: Anesthesiology

## 2013-03-29 ENCOUNTER — Other Ambulatory Visit: Payer: Self-pay | Admitting: Obstetrics & Gynecology

## 2013-03-29 ENCOUNTER — Encounter (HOSPITAL_COMMUNITY)
Admission: RE | Disposition: A | Discharge status: Home / Self Care | Payer: Self-pay | Source: Ambulatory Visit | Attending: Obstetrics & Gynecology

## 2013-03-29 DIAGNOSIS — N920 Excessive and frequent menstruation with regular cycle: Secondary | ICD-10-CM | POA: Insufficient documentation

## 2013-03-29 DIAGNOSIS — D250 Submucous leiomyoma of uterus: Secondary | ICD-10-CM

## 2013-03-29 DIAGNOSIS — D500 Iron deficiency anemia secondary to blood loss (chronic): Secondary | ICD-10-CM | POA: Insufficient documentation

## 2013-03-29 DIAGNOSIS — R9389 Abnormal findings on diagnostic imaging of other specified body structures: Secondary | ICD-10-CM

## 2013-03-29 HISTORY — PX: DILATATION & CURETTAGE/HYSTEROSCOPY WITH TRUECLEAR: SHX6353

## 2013-03-29 LAB — CBC
HCT: 33.4 % — ABNORMAL LOW (ref 36.0–46.0)
Hemoglobin: 11.1 g/dL — ABNORMAL LOW (ref 12.0–15.0)
MCV: 80.3 fL (ref 78.0–100.0)
Platelets: 326 10*3/uL (ref 150–400)
RBC: 4.16 MIL/uL (ref 3.87–5.11)
RDW: 15.3 % (ref 11.5–15.5)
WBC: 3.5 10*3/uL — ABNORMAL LOW (ref 4.0–10.5)

## 2013-03-29 SURGERY — DILATATION & CURETTAGE/HYSTEROSCOPY WITH TRUCLEAR
Anesthesia: General | Site: Uterus | Wound class: Clean Contaminated

## 2013-03-29 MED ORDER — KETOROLAC TROMETHAMINE 30 MG/ML IJ SOLN: 15.0000 mg | Freq: Once | INTRAMUSCULAR | Status: DC | PRN

## 2013-03-29 MED ORDER — LACTATED RINGERS IV SOLN
INTRAVENOUS | Status: DC
  Administered 2013-03-29 (×2): via INTRAVENOUS

## 2013-03-29 MED ORDER — DROSPIREN-ETH ESTRAD-LEVOMEFOL 3-0.03-0.451 MG PO TABS: 1.0000 | ORAL_TABLET | Freq: Every day | ORAL | Status: DC

## 2013-03-29 MED ORDER — PROPOFOL 10 MG/ML IV BOLUS
INTRAVENOUS | Status: DC | PRN
  Administered 2013-03-29: 200 mg via INTRAVENOUS

## 2013-03-29 MED ORDER — PROMETHAZINE HCL 25 MG/ML IJ SOLN: 6.2500 mg | INTRAMUSCULAR | Status: DC | PRN

## 2013-03-29 MED ORDER — OXYCODONE-ACETAMINOPHEN 5-325 MG PO TABS
1.0000 | ORAL_TABLET | ORAL | Status: DC | PRN
  Administered 2013-03-29: 1 via ORAL

## 2013-03-29 MED ORDER — FENTANYL CITRATE 0.05 MG/ML IJ SOLN
25.0000 ug | INTRAMUSCULAR | Status: DC | PRN
  Administered 2013-03-29: 25 ug via INTRAVENOUS

## 2013-03-29 MED ORDER — ONDANSETRON HCL 4 MG/2ML IJ SOLN
INTRAMUSCULAR | Status: DC | PRN
  Administered 2013-03-29: 4 mg via INTRAVENOUS

## 2013-03-29 MED ORDER — METHYLERGONOVINE MALEATE 0.2 MG/ML IJ SOLN
INTRAMUSCULAR | Status: DC | PRN
  Administered 2013-03-29: 0.2 mg via INTRAMUSCULAR

## 2013-03-29 MED ORDER — FENTANYL CITRATE 0.05 MG/ML IJ SOLN
INTRAMUSCULAR | Status: DC | PRN
  Administered 2013-03-29: 25 ug via INTRAVENOUS
  Administered 2013-03-29: 100 ug via INTRAVENOUS
  Administered 2013-03-29: 25 ug via INTRAVENOUS

## 2013-03-29 MED ORDER — LIDOCAINE HCL (CARDIAC) 20 MG/ML IV SOLN
INTRAVENOUS | Status: DC | PRN
  Administered 2013-03-29: 80 mg via INTRAVENOUS

## 2013-03-29 MED ORDER — MIDAZOLAM HCL 2 MG/2ML IJ SOLN
INTRAMUSCULAR | Status: DC | PRN
  Administered 2013-03-29: 2 mg via INTRAVENOUS

## 2013-03-29 MED ORDER — OXYCODONE-ACETAMINOPHEN 5-325 MG PO TABS
ORAL_TABLET | ORAL | Status: AC
  Filled 2013-03-29: qty 1

## 2013-03-29 MED ORDER — PHENYLEPHRINE HCL 10 MG/ML IJ SOLN
INTRAMUSCULAR | Status: DC | PRN
  Administered 2013-03-29 (×2): 80 ug via INTRAVENOUS
  Administered 2013-03-29: 40 ug via INTRAVENOUS

## 2013-03-29 MED ORDER — DEXAMETHASONE SODIUM PHOSPHATE 4 MG/ML IJ SOLN
INTRAMUSCULAR | Status: DC | PRN
  Administered 2013-03-29: 10 mg via INTRAVENOUS

## 2013-03-29 MED ORDER — MIDAZOLAM HCL 2 MG/2ML IJ SOLN: 0.5000 mg | Freq: Once | INTRAMUSCULAR | Status: DC | PRN

## 2013-03-29 MED ORDER — FENTANYL CITRATE 0.05 MG/ML IJ SOLN
INTRAMUSCULAR | Status: AC
  Administered 2013-03-29: 25 ug via INTRAVENOUS
  Filled 2013-03-29: qty 2

## 2013-03-29 MED ORDER — MEPERIDINE HCL 25 MG/ML IJ SOLN: 6.2500 mg | INTRAMUSCULAR | Status: DC | PRN

## 2013-03-29 MED ORDER — SILVER NITRATE-POT NITRATE 75-25 % EX MISC
CUTANEOUS | Status: AC
  Filled 2013-03-29: qty 1

## 2013-03-29 MED ORDER — SODIUM CHLORIDE 0.9 % IR SOLN
Status: DC | PRN
  Administered 2013-03-29: 3000 mL

## 2013-03-29 MED ORDER — LACTATED RINGERS IV SOLN: INTRAVENOUS | Status: DC

## 2013-03-29 MED ORDER — OXYCODONE-ACETAMINOPHEN 5-325 MG PO TABS: 1.0000 | ORAL_TABLET | ORAL | Status: DC | PRN

## 2013-03-29 SURGICAL SUPPLY — 22 items
BLADE INCISOR TRUC PLUS 2.9 (ABLATOR) ×1 IMPLANT
CANISTERS HI-FLOW 3000CC (CANNISTER) ×2 IMPLANT
CATH ROBINSON RED A/P 16FR (CATHETERS) ×2 IMPLANT
CLOTH BEACON ORANGE TIMEOUT ST (SAFETY) ×2 IMPLANT
CONTAINER PREFILL 10% NBF 60ML (FORM) ×4 IMPLANT
DRAPE HYSTEROSCOPY (DRAPE) ×2 IMPLANT
DRESSING TELFA 8X3 (GAUZE/BANDAGES/DRESSINGS) ×2 IMPLANT
ELECT REM PT RETURN 9FT ADLT (ELECTROSURGICAL) ×2
ELECTRODE REM PT RTRN 9FT ADLT (ELECTROSURGICAL) ×1 IMPLANT
GLOVE BIO SURGEON STRL SZ7 (GLOVE) ×2 IMPLANT
GLOVE BIOGEL PI IND STRL 7.0 (GLOVE) ×1 IMPLANT
GLOVE BIOGEL PI INDICATOR 7.0 (GLOVE) ×1
GOWN STRL REIN XL XLG (GOWN DISPOSABLE) ×4 IMPLANT
INCISOR TRUC PLUS BLADE 2.9 (ABLATOR) ×2
KIT HYSTEROSCOPY TRUCLEAR (ABLATOR) ×2 IMPLANT
MORCELLATOR RECIP TRUCLEAR 4.0 (ABLATOR) ×2 IMPLANT
NEEDLE SPNL 22GX3.5 QUINCKE BK (NEEDLE) ×2 IMPLANT
PACK VAGINAL MINOR WOMEN LF (CUSTOM PROCEDURE TRAY) ×2 IMPLANT
PAD OB MATERNITY 4.3X12.25 (PERSONAL CARE ITEMS) ×2 IMPLANT
SYR CONTROL 10ML LL (SYRINGE) ×2 IMPLANT
TOWEL OR 17X24 6PK STRL BLUE (TOWEL DISPOSABLE) ×4 IMPLANT
WATER STERILE IRR 1000ML POUR (IV SOLUTION) ×2 IMPLANT

## 2013-03-29 NOTE — Transfer of Care (Signed)
Immediate Anesthesia Transfer of Care Note  Patient: Molly Smith  Procedure(s) Performed: Procedure(s): DILATATION & CURETTAGE/HYSTEROSCOPY WITH TRUCLEAR (N/A)  Patient Location: PACU  Anesthesia Type:General  Level of Consciousness: awake, alert  and oriented  Airway & Oxygen Therapy: Patient Spontanous Breathing and Patient connected to nasal cannula oxygen  Post-op Assessment: Report given to PACU RN and Post -op Vital signs reviewed and stable  Post vital signs: Reviewed and stable  Complications: No apparent anesthesia complications

## 2013-03-29 NOTE — H&P (Addendum)
Molly Smith is an 32 y.o. female with menorrhagia on OCPs causing anemia.  Pt has suspected polyp on [place].    Pertinent Gynecological History: Menses: excesive flow causing anemia Bleeding: intermenstrual bleeding and heavy menses Contraception: OCP (estrogen/progesterone) DES exposure: denies Blood transfusions: none Sexually transmitted diseases: denies Previous GYN Procedures: NSVD  Last mammogram: Has appt for breast mass.  Last pap: normal OB History: G2, P1011   Menstrual History:  Patient's last menstrual period was 03/05/2013. Currently spotting    Past Medical History  Diagnosis Date  . Bacterial vaginosis     recurrent  . Hypertension     resolved - no meds- no bp problems per patient  . SVD (spontaneous vaginal delivery)     x 1    Past Surgical History  Procedure Laterality Date  . Breast lumpectomy      lt breast  benign  . Tooth extraction      Family History  Problem Relation Age of Onset  . Hypertension Father   . Stroke Paternal Grandmother   . Cancer Maternal Grandfather     Social History:  reports that she quit smoking about 6 months ago. Her smoking use included Cigarettes. She has a 2.4 pack-year smoking history. She has never used smokeless tobacco. She reports that she drinks alcohol. She reports that she does not use illicit drugs.  Allergies:  Allergies  Allergen Reactions  . Bactrim [Sulfamethoxazole-Trimethoprim] Hives  . Doxycycline Hives  . Penicillins Rash    Prescriptions prior to admission  Medication Sig Dispense Refill  . fluticasone (FLONASE) 50 MCG/ACT nasal spray Place 2 sprays into the nose daily.  50 g  6  . ibuprofen (ADVIL,MOTRIN) 800 MG tablet Take 800 mg by mouth every 8 (eight) hours as needed for pain.      . megestrol (MEGACE) 40 MG tablet Take 1 tablet (40 mg total) by mouth daily.  14 tablet  0  . metroNIDAZOLE (METROGEL) 0.75 % vaginal gel Place 1 Applicatorful vaginally 2 (two) times daily.      .  Drospirenone-Ethinyl Estradiol-Levomefol (SAFYRAL) 3-0.03-0.451 MG tablet Take 1 tablet by mouth daily. Take daily continuously  2 Package  11  . EPINEPHrine (EPI-PEN) 0.3 mg/0.3 mL DEVI Inject 0.3 mLs (0.3 mg total) into the muscle once.  1 Device  2  . Fluconazole (DIFLUCAN PO) Take 150 mg by mouth once.       Marland Kitchen HYDROcodone-acetaminophen (NORCO) 7.5-325 MG per tablet 1 tablet every 6 (six) hours as needed.       . methylPREDNIsolone (MEDROL DOSPACK) 4 MG tablet         Review of Systems  Constitutional: Negative.   HENT: Negative.   Respiratory: Negative.   Cardiovascular: Negative.   Gastrointestinal: Negative.   Genitourinary:       Scant brown spotting  Musculoskeletal: Negative.     Blood pressure 109/67, pulse 72, temperature 99.3 F (37.4 C), temperature source Oral, resp. rate 18, height 5' 5.5" (1.664 m), weight 161 lb (73.029 kg), last menstrual period 03/05/2013, SpO2 100.00%. Physical Exam  Vitals reviewed. Constitutional: She is oriented to person, place, and time. She appears well-developed and well-nourished. No distress.  HENT:  Head: Normocephalic and atraumatic.  Mouth/Throat: No oropharyngeal exudate.  Eyes: Conjunctivae are normal.  Neck: Neck supple. No thyromegaly present.  Cardiovascular: Normal rate and regular rhythm.   Respiratory: Effort normal and breath sounds normal.  GI: Soft. She exhibits no distension. There is no tenderness. There is no rebound and  no guarding.  Genitourinary:  Pelvic to be repeated in OR.  Musculoskeletal: She exhibits no edema and no tenderness.  Neurological: She is alert and oriented to person, place, and time.  Skin: Skin is warm and dry.  Psychiatric: She has a normal mood and affect.    Results for orders placed during the hospital encounter of 03/29/13 (from the past 24 hour(s))  PREGNANCY, URINE     Status: None   Collection Time    03/29/13  9:00 AM      Result Value Range   Preg Test, Ur NEGATIVE  NEGATIVE   CBC     Status: Abnormal   Collection Time    03/29/13  9:05 AM      Result Value Range   WBC 3.5 (*) 4.0 - 10.5 K/uL   RBC 4.16  3.87 - 5.11 MIL/uL   Hemoglobin 11.1 (*) 12.0 - 15.0 g/dL   HCT 16.1 (*) 09.6 - 04.5 %   MCV 80.3  78.0 - 100.0 fL   MCH 26.7  26.0 - 34.0 pg   MCHC 33.2  30.0 - 36.0 g/dL   RDW 40.9  81.1 - 91.4 %   Platelets 326  150 - 400 K/uL    Uterus: Uterus measures 10.6 x 5.4 x 5.9 cm. There is slight  anteversion. There is a somewhat heterogeneous appearance of the  myometrium but no discrete myometrial mass or lesion is evident.  Endometrium: There is abnormal thickening of the endometrium which  had a thickness of 2 cm. There is a small amount of fluid in the  endometrial cavity. In the endometrial cavity there is a complex  solid appearing area, polyp, or mass with increased Doppler color  flow. This area measures 3.6 x 3.4 x 2.0 cm.  Right ovary: Right ovary measures to 4 x 1.7 x 2.0 cm. No right  ovarian lesion was seen. With color Doppler examination there is  color blood flow is seen in the parenchyma of the right ovary.  Left ovary: Left ovary measures 2.3 x 1.1 x 1.2 cm. No left  ovarian lesion is seen. Color Doppler examination there is some  internal flow within the parenchyma of the left ovary.  Other Findings: No free fluid was seen within the pelvis.  On the right lateral to the uterus with a solid-appearing area  measuring 1.9 x 1.5 x 1.3 cm. With color Doppler this has some  internal color Doppler flow.  IMPRESSION:  Mildly heterogeneous appearance of the myometrium without discrete  myometrial mass identified.  Abnormal thickening of the endometrium which had a thickness of 2  cm. There is a small amount of fluid in the endometrial cavity.  There is a complex solid appearing area, polyp, or mass in the  endometrial cavity with internal Doppler color flow. This area  measures 3.6 x 3.4 x 2.0 cm. .  No ovarian lesion is seen. No free fluid  was seen in the pelvis.  On the right lateral to the uterus with a solid-appearing area  measuring 1.9 x 1.5 x 1.3 cm. With color Doppler this has some  internal color Doppler flow.  Endometrium, biopsy - DEGENERATING SECRETORY-TYPE ENDOMETRIUM. - BENIGN POLYPOID ENDOCERVICAL TYPE MUCOSA. - THERE IS NO EVIDENCE OF HYPERPLASIA OR MALIGNANCY.  Assessment/Plan: Molly Smith is an 32 y.o. female with menorrhagia on OCPs causing anemia.  Pt has suspected polyp on [place].  Biopsy is benign with evidence of polyp. Pt consented for D & C, hysteroscopy, and  polypectomy.  Risks include but not limited to bleeding, infection, damage to uterus.  SCDs placed for DVT prophylaxis.  All questions answered.   Izzac Rockett H. 03/29/2013, 9:48 AM

## 2013-03-29 NOTE — Op Note (Signed)
PREOPERATIVE DIAGNOSIS:  32 y.o. with [place] evidence of polyp on [place]  POSTOPERATIVE DIAGNOSIS: The same  PROCEDURE: Cervical dilation,h ysteroscopy, and resection of submucosal fibroid  SURGEON:  Dr. Elsie Lincoln  INDICATIONS: 32 y.o. G2P0011  here for scheduled surgery for suspected endometrial polyp on [place] causing menorrhagia.   Risks of surgery were discussed with the patient including but not limited to: bleeding which may require transfusion; infection which may require antibiotics; injury to uterus or surrounding organs; intrauterine scarring which may impair future fertility; need for additional procedures including laparotomy or laparoscopy; and other postoperative/anesthesia complications. Written informed consent was obtained.    FINDINGS:  A normal sized anteverted uterus, thin endometrium, normal ostia bilaterally, 3 cm submucosal fibroid  ANESTHESIA:   General  ESTIMATED BLOOD LOSS:  50 ml  SPECIMENS: Endometrial curettings sent to pathology  COMPLICATIONS:  None immediate.  PROCEDURE DETAILS:  The patient was taken to the operating room where general anesthesia was administered and was found to be adequate.  After an adequate timeout was performed, she was placed in the dorsal lithotomy position and examined; then prepped and draped in the sterile manner.   Her bladder was catheterized for an unmeasured amount of clear, yellow urine. A speculum was then placed in the patient's vagina and a single tooth tenaculum was applied to the anterior lip of the cervix.  The cervix was dilated manually with metal dilators to accommodate the 6 mm diagnostic hysteroscope.  Once the cervix was dilated, the hysteroscope was inserted under direct visualization using saline as a suspension medium.  The uterine cavity was carefully examined, both ostia were recognized.   The lesion was visualized  And was thought to be a submucosal fibroid and not a polyp.  The cervix was then further dilated to 9 mm in  order to accommodate the larger bladed device.  The Tru Clear device was inserted.  Under direct visualization the 3 cm submucosal fibroid was excised with the rotating blade.  There was 350 cc fluid loss at the end of the procedure.  There was some bleeding from the base of the fibroid which resolved.  The tenaculum was removed from the anterior lip of the cervix and the vaginal speculum was removed after noting good hemostasis.  The patient tolerated the procedure well and was taken to the recovery area awake, extubated and in stable condition.

## 2013-03-29 NOTE — Anesthesia Preprocedure Evaluation (Signed)
Anesthesia Evaluation  Patient identified by MRN, date of birth, ID band Patient awake    Reviewed: Allergy & Precautions, H&P , Patient's Chart, lab work & pertinent test results, reviewed documented beta blocker date and time   History of Anesthesia Complications Negative for: history of anesthetic complications  Airway Mallampati: II TM Distance: >3 FB Neck ROM: full    Dental no notable dental hx.    Pulmonary neg pulmonary ROS,  breath sounds clear to auscultation  Pulmonary exam normal       Cardiovascular Exercise Tolerance: Good negative cardio ROS  Rhythm:regular Rate:Normal     Neuro/Psych negative neurological ROS  negative psych ROS   GI/Hepatic negative GI ROS, Neg liver ROS,   Endo/Other  negative endocrine ROS  Renal/GU negative Renal ROS     Musculoskeletal   Abdominal   Peds  Hematology negative hematology ROS (+)   Anesthesia Other Findings   Reproductive/Obstetrics negative OB ROS                           Anesthesia Physical Anesthesia Plan  ASA: II  Anesthesia Plan: General LMA   Post-op Pain Management:    Induction:   Airway Management Planned:   Additional Equipment:   Intra-op Plan:   Post-operative Plan:   Informed Consent: I have reviewed the patients History and Physical, chart, labs and discussed the procedure including the risks, benefits and alternatives for the proposed anesthesia with the patient or authorized representative who has indicated his/her understanding and acceptance.   Dental Advisory Given  Plan Discussed with: CRNA, Surgeon and Anesthesiologist  Anesthesia Plan Comments:         Anesthesia Quick Evaluation

## 2013-03-29 NOTE — Anesthesia Postprocedure Evaluation (Signed)
Anesthesia Post Note  Patient: Molly Smith  Procedure(s) Performed: Procedure(s) (LRB): DILATATION & CURETTAGE/HYSTEROSCOPY WITH TRUCLEAR (N/A)  Anesthesia type: General  Patient location: PACU  Post pain: Pain level controlled  Post assessment: Post-op Vital signs reviewed  Last Vitals:  Filed Vitals:   03/29/13 1130  BP: 107/60  Pulse: 93  Temp:   Resp: 16    Post vital signs: Reviewed  Level of consciousness: sedated  Complications: No apparent anesthesia complications

## 2013-03-29 NOTE — Anesthesia Procedure Notes (Signed)
Procedure Name: LMA Insertion Date/Time: 03/29/2013 10:17 AM Performed by: Graciela Husbands Pre-anesthesia Checklist: Patient identified, Emergency Drugs available, Suction available, Patient being monitored and Timeout performed Patient Re-evaluated:Patient Re-evaluated prior to inductionOxygen Delivery Method: Circle system utilized Preoxygenation: Pre-oxygenation with 100% oxygen Intubation Type: IV induction Ventilation: Mask ventilation without difficulty LMA: LMA inserted LMA Size: 4.0 Number of attempts: 1 Placement Confirmation: positive ETCO2 and breath sounds checked- equal and bilateral Tube secured with: Tape Dental Injury: Teeth and Oropharynx as per pre-operative assessment

## 2013-04-01 ENCOUNTER — Encounter (HOSPITAL_COMMUNITY): Payer: Self-pay | Admitting: Obstetrics & Gynecology

## 2013-04-07 ENCOUNTER — Encounter: Payer: Self-pay | Admitting: Obstetrics & Gynecology

## 2013-04-12 ENCOUNTER — Other Ambulatory Visit: Payer: BC Managed Care – PPO

## 2013-05-07 ENCOUNTER — Ambulatory Visit (INDEPENDENT_AMBULATORY_CARE_PROVIDER_SITE_OTHER): Payer: 59 | Admitting: Obstetrics & Gynecology

## 2013-05-07 ENCOUNTER — Encounter: Payer: Self-pay | Admitting: Obstetrics & Gynecology

## 2013-05-07 VITALS — BP 133/89 | HR 105 | Resp 16 | Ht 66.0 in | Wt 164.0 lb

## 2013-05-07 DIAGNOSIS — N898 Other specified noninflammatory disorders of vagina: Secondary | ICD-10-CM

## 2013-05-07 NOTE — Progress Notes (Signed)
   Subjective:    Patient ID: Molly Smith, female    DOB: Oct 03, 1980, 33 y.o.   MRN: [id number]  HPI  Pt presents for post op check after hysteroscopic resection of submucosal fibroid.  Pt has one one menses since procedure.  It was light and she di dnot have to change pads frequently.  Pt thinks she make have a BV infection.   Review of Systems  AS above    Objective:   Physical Exam  Vitals reviewed. Constitutional: She is oriented to person, place, and time. She appears well-developed and well-nourished. No distress.  HENT:  Head: Normocephalic and atraumatic.  Eyes: Conjunctivae are normal.  Pulmonary/Chest: Effort normal.  Abdominal: Soft. She exhibits no distension. There is no tenderness.  Genitourinary: Vagina normal and uterus normal.  Musculoskeletal: Normal range of motion.  Neurological: She is alert and oriented to person, place, and time.  Skin: Skin is warm and dry.          Assessment & Plan:  32 yo female doing well after myomectomy.  1-BD affirm 2-Contoinue OCPs. 3-Pap due in 2015

## 2013-05-10 ENCOUNTER — Telehealth: Payer: Self-pay | Admitting: *Deleted

## 2013-05-10 NOTE — Telephone Encounter (Signed)
Message copied by Arne Cleveland on Fri May 10, 2013  8:57 AM ------      Message from: Lesly Dukes      Created: Fri May 10, 2013  7:17 AM       Vaginitis panel negative.  RN to call pt. ------

## 2013-05-10 NOTE — Telephone Encounter (Signed)
Called pt to adv labs WNL - Pt expressed understanding

## 2013-08-13 ENCOUNTER — Ambulatory Visit (INDEPENDENT_AMBULATORY_CARE_PROVIDER_SITE_OTHER): Payer: 59 | Admitting: Obstetrics & Gynecology

## 2013-08-13 ENCOUNTER — Encounter: Payer: Self-pay | Admitting: Obstetrics & Gynecology

## 2013-08-13 VITALS — BP 143/91 | HR 92 | Resp 16 | Ht 66.0 in | Wt 163.0 lb

## 2013-08-13 DIAGNOSIS — L918 Other hypertrophic disorders of the skin: Secondary | ICD-10-CM

## 2013-08-13 DIAGNOSIS — L909 Atrophic disorder of skin, unspecified: Secondary | ICD-10-CM

## 2013-08-13 DIAGNOSIS — R300 Dysuria: Secondary | ICD-10-CM

## 2013-08-13 DIAGNOSIS — R309 Painful micturition, unspecified: Secondary | ICD-10-CM

## 2013-08-13 DIAGNOSIS — L919 Hypertrophic disorder of the skin, unspecified: Secondary | ICD-10-CM

## 2013-08-13 LAB — POCT URINALYSIS DIPSTICK
Bilirubin, UA: NEGATIVE
Glucose, UA: NEGATIVE
KETONES UA: NEGATIVE
Nitrite, UA: POSITIVE
PH UA: 6.5
Protein, UA: NEGATIVE
RBC UA: NEGATIVE
Spec Grav, UA: 1.01
Urobilinogen, UA: NEGATIVE

## 2013-08-13 MED ORDER — CEPHALEXIN 500 MG PO CAPS: 500.0000 mg | ORAL_CAPSULE | Freq: Three times a day (TID) | ORAL | Status: DC

## 2013-08-13 NOTE — Progress Notes (Signed)
   Subjective:    Patient ID: Molly Smith, female    DOB: Jul 21, 1980, 33 y.o.   MRN: [id number]  HPI  Pt on continueous OPCs and having some brown spotting.  No menorrhagia or pelvic pain.  Pt has symptoms of uti and UA is positive for nitrites and leukocytes.  Pt has PCN alergy but can take Keflex.   Review of Systems See HPI    Objective:   Physical Exam  None indicated       Assessment & Plan:  33 yo female on continueous OPCs and breakthrough bleeding.  Pt has UTI  1-Pt to take placebo pill and see if this hleps with next cycle.  Pt will decide whether she will continue to do continuous OCPs and let us know.   2-Keflex for UTI.  Reviewed wiping front to back and pt will begin this immediately.    Counseling with >50% of visit.

## 2013-08-16 LAB — CULTURE, URINE COMPREHENSIVE

## 2013-08-21 ENCOUNTER — Telehealth: Payer: Self-pay | Admitting: *Deleted

## 2013-08-21 DIAGNOSIS — B3731 Acute candidiasis of vulva and vagina: Secondary | ICD-10-CM

## 2013-08-21 DIAGNOSIS — B373 Candidiasis of vulva and vagina: Secondary | ICD-10-CM

## 2013-08-21 MED ORDER — FLUCONAZOLE 150 MG PO TABS: 150.0000 mg | ORAL_TABLET | Freq: Once | ORAL | Status: DC

## 2013-08-21 NOTE — Telephone Encounter (Signed)
Pt called and states she has gotten a yeast infection from abx Rx for UTI. I sent e-script for Diflucan to pt pharm. Pt expressed understanding to pick up meds

## 2013-09-16 ENCOUNTER — Other Ambulatory Visit: Payer: 59

## 2013-09-30 ENCOUNTER — Other Ambulatory Visit: Payer: Self-pay | Admitting: *Deleted

## 2013-09-30 DIAGNOSIS — N920 Excessive and frequent menstruation with regular cycle: Secondary | ICD-10-CM

## 2013-09-30 MED ORDER — DROSPIREN-ETH ESTRAD-LEVOMEFOL 3-0.03-0.451 MG PO TABS
1.0000 | ORAL_TABLET | Freq: Every day | ORAL | Status: DC
Start: 2013-09-30 — End: 2013-10-28

## 2013-09-30 NOTE — Telephone Encounter (Signed)
Pt called requesting a RF on OCP to be sent to [facility]

## 2013-10-28 ENCOUNTER — Telehealth: Payer: Self-pay | Admitting: *Deleted

## 2013-10-28 DIAGNOSIS — IMO0001 Reserved for inherently not codable concepts without codable children: Secondary | ICD-10-CM

## 2013-10-28 MED ORDER — DROSPIRENONE-ETHINYL ESTRADIOL 3-0.03 MG PO TABS: 1.0000 | ORAL_TABLET | Freq: Every day | ORAL | Status: DC

## 2013-10-28 NOTE — Telephone Encounter (Signed)
Pt called stating that her insurance will pay for Yasmin  But not Safryl.  Request made for a change sent to [facility] [address]

## 2013-12-09 ENCOUNTER — Ambulatory Visit: Payer: 59 | Admitting: Obstetrics & Gynecology

## 2014-01-07 ENCOUNTER — Encounter: Payer: Self-pay | Admitting: Obstetrics & Gynecology

## 2014-01-07 ENCOUNTER — Ambulatory Visit (INDEPENDENT_AMBULATORY_CARE_PROVIDER_SITE_OTHER): Payer: 59 | Admitting: Obstetrics & Gynecology

## 2014-01-07 VITALS — BP 133/89 | HR 104 | Resp 16 | Ht 66.0 in | Wt 166.0 lb

## 2014-01-07 DIAGNOSIS — Z1151 Encounter for screening for human papillomavirus (HPV): Secondary | ICD-10-CM

## 2014-01-07 DIAGNOSIS — Z3041 Encounter for surveillance of contraceptive pills: Secondary | ICD-10-CM

## 2014-01-07 DIAGNOSIS — N63 Unspecified lump in unspecified breast: Secondary | ICD-10-CM

## 2014-01-07 DIAGNOSIS — Z124 Encounter for screening for malignant neoplasm of cervix: Secondary | ICD-10-CM

## 2014-01-07 DIAGNOSIS — Z01419 Encounter for gynecological examination (general) (routine) without abnormal findings: Secondary | ICD-10-CM

## 2014-01-07 MED ORDER — FLUCONAZOLE 150 MG PO TABS: 150.0000 mg | ORAL_TABLET | Freq: Once | ORAL | Status: DC

## 2014-01-07 MED ORDER — NORGESTIMATE-ETH ESTRADIOL 0.25-35 MG-MCG PO TABS: 1.0000 | ORAL_TABLET | Freq: Every day | ORAL | Status: DC

## 2014-01-07 NOTE — Progress Notes (Signed)
   Subjective:    Patient ID: Molly Smith, female    DOB: 1981/04/22, 33 y.o.   MRN: [id number]  Gynecologic Exam   Pt is c/o only brown spotting during the 4th week of her pill pack.  She wants to see red blood.  Explained that her endometrium has not developed enough to have a large amount to slough and therefore the regular, monthly brown spotting is nml and is her period.  Pt is on safryl (was on these pills prior to hysteroscopic myomectomy).  Pt would like a lower dose pill now. Pt also requests pap smear   Review of Systems No problems.      Objective:   Physical Exam  Vitals reviewed. Constitutional: She is oriented to person, place, and time. She appears well-developed and well-nourished.  HENT:  Head: Normocephalic and atraumatic.  Eyes: Conjunctivae are normal.  Pulmonary/Chest: Effort normal.  Abdominal: Soft. She exhibits no distension and no mass. There is no tenderness. There is no rebound and no guarding.  Genitourinary: Vagina normal and uterus normal.  Adnexa--no masses, non tender  Musculoskeletal: She exhibits no edema.  Neurological: She is alert and oriented to person, place, and time.  Skin: Skin is warm and dry.  Psychiatric: She has a normal mood and affect.    Filed Vitals:   01/07/14 1500  BP: 133/89  Pulse: 104  Resp: 16  Height: 5\' 6"  (1.676 m)  Weight: 166 lb (75.297 kg)           Assessment & Plan:  33 yo female with nml menstrual cycle on OCPs 1-switch to orthcyclen 2-pap with cotesting today 3-discussed future plans for child bearing. 4-Pt needs to f/u with radiology and breast mass.

## 2014-01-10 LAB — CYTOLOGY - PAP

## 2014-01-15 ENCOUNTER — Telehealth: Payer: Self-pay | Admitting: *Deleted

## 2014-01-15 NOTE — Telephone Encounter (Signed)
Pt notified that she needs f/u on her left breast U/S from 9/14.  She was to f/u in 6 months and now it has almost been a year.  Orders are placed in Epic and pt states that she will call.  She stated that she appreciated me calling and reminding her of that.

## 2014-02-20 ENCOUNTER — Other Ambulatory Visit: Payer: Self-pay | Admitting: Obstetrics & Gynecology

## 2014-02-20 ENCOUNTER — Telehealth: Payer: Self-pay

## 2014-02-20 DIAGNOSIS — N6310 Unspecified lump in the right breast, unspecified quadrant: Secondary | ICD-10-CM

## 2014-02-20 DIAGNOSIS — Z139 Encounter for screening, unspecified: Secondary | ICD-10-CM

## 2014-02-20 DIAGNOSIS — N6320 Unspecified lump in the left breast, unspecified quadrant: Principal | ICD-10-CM

## 2014-02-20 NOTE — Addendum Note (Signed)
Addended by: Guido Sander D on: 02/20/2014 10:28 AM   Modules accepted: Orders

## 2014-02-20 NOTE — Telephone Encounter (Signed)
Orders for MM screening

## 2014-03-06 ENCOUNTER — Other Ambulatory Visit: Payer: Self-pay | Admitting: Obstetrics & Gynecology

## 2014-03-06 ENCOUNTER — Ambulatory Visit
Admission: RE | Admit: 2014-03-06 | Discharge: 2014-03-06 | Disposition: A | Discharge status: Home / Self Care | Payer: 59 | Source: Ambulatory Visit | Attending: Obstetrics & Gynecology | Admitting: Obstetrics & Gynecology

## 2014-03-06 ENCOUNTER — Encounter (INDEPENDENT_AMBULATORY_CARE_PROVIDER_SITE_OTHER): Payer: Self-pay

## 2014-03-06 DIAGNOSIS — N63 Unspecified lump in unspecified breast: Secondary | ICD-10-CM

## 2014-03-06 DIAGNOSIS — N6320 Unspecified lump in the left breast, unspecified quadrant: Principal | ICD-10-CM

## 2014-03-06 DIAGNOSIS — N6310 Unspecified lump in the right breast, unspecified quadrant: Secondary | ICD-10-CM

## 2014-03-13 ENCOUNTER — Encounter: Payer: Self-pay | Admitting: Obstetrics & Gynecology

## 2014-03-31 ENCOUNTER — Encounter: Payer: Self-pay | Admitting: Obstetrics & Gynecology

## 2015-01-13 ENCOUNTER — Ambulatory Visit: Payer: Self-pay | Admitting: Obstetrics & Gynecology

## 2015-01-15 ENCOUNTER — Encounter (INDEPENDENT_AMBULATORY_CARE_PROVIDER_SITE_OTHER): Payer: Self-pay

## 2015-01-15 ENCOUNTER — Encounter: Payer: Self-pay | Admitting: Obstetrics & Gynecology

## 2015-01-15 ENCOUNTER — Ambulatory Visit (INDEPENDENT_AMBULATORY_CARE_PROVIDER_SITE_OTHER): Payer: BC Managed Care – PPO | Admitting: Obstetrics & Gynecology

## 2015-01-15 VITALS — BP 123/80 | HR 73 | Resp 16 | Ht 66.0 in | Wt 172.0 lb

## 2015-01-15 DIAGNOSIS — Z91030 Bee allergy status: Secondary | ICD-10-CM

## 2015-01-15 DIAGNOSIS — Z0000 Encounter for general adult medical examination without abnormal findings: Secondary | ICD-10-CM

## 2015-01-15 DIAGNOSIS — Z01419 Encounter for gynecological examination (general) (routine) without abnormal findings: Secondary | ICD-10-CM

## 2015-01-15 DIAGNOSIS — Z91038 Other insect allergy status: Secondary | ICD-10-CM

## 2015-01-15 DIAGNOSIS — R7689 Other specified abnormal immunological findings in serum: Secondary | ICD-10-CM | POA: Insufficient documentation

## 2015-01-15 DIAGNOSIS — R768 Other specified abnormal immunological findings in serum: Secondary | ICD-10-CM | POA: Insufficient documentation

## 2015-01-15 DIAGNOSIS — Z113 Encounter for screening for infections with a predominantly sexual mode of transmission: Secondary | ICD-10-CM | POA: Diagnosis not present

## 2015-01-15 DIAGNOSIS — R894 Abnormal immunological findings in specimens from other organs, systems and tissues: Secondary | ICD-10-CM | POA: Diagnosis not present

## 2015-01-15 MED ORDER — EPINEPHRINE 0.3 MG/0.3ML IJ SOAJ: 0.3000 mg | Freq: Once | INTRAMUSCULAR | Status: DC

## 2015-01-15 MED ORDER — TRIAMCINOLONE ACETONIDE 0.5 % EX OINT: 1.0000 "application " | TOPICAL_OINTMENT | Freq: Two times a day (BID) | CUTANEOUS | Status: AC

## 2015-01-15 MED ORDER — NORGESTIMATE-ETH ESTRADIOL 0.25-35 MG-MCG PO TABS: 1.0000 | ORAL_TABLET | Freq: Every day | ORAL | Status: DC

## 2015-01-15 NOTE — Progress Notes (Signed)
  Subjective:     Molly Smith is a 34 y.o. female here for a routine exam.  Current complaints: rash on inner left wrist.  She is unsure what causes these periodic rashes. She is seeing her primary care doctor in the past for this. She is also seen an allergist who says that she is only allergic to wasps. We will try triamcinolone cream at this time. If the rash persists or she gets further breakouts patient will follow up with her primary care doctor or allergist..  Patient also has questions about Valtrex, HSV-2, and pregnancy. Patient was given information about taking Valtrex in pregnancy to prevent vertical transmission.    Gynecologic History Patient's last menstrual period was 12/15/2014. Contraception: OCP (estrogen/progesterone) Last Pap: 2015. Results were: normal   Obstetric History OB History  Gravida Para Term Preterm AB SAB TAB Ectopic Multiple Living  2 1   1 1    1     # Outcome Date GA Lbr Len/2nd Weight Sex Delivery Anes PTL Lv  2 SAB           1 Para                The following portions of the patient's history were reviewed and updated as appropriate: allergies, current medications, past family history, past medical history, past social history, past surgical history and problem list.  Review of Systems Pertinent items are noted in HPI.    Objective:      Filed Vitals:   01/15/15 0855  BP: 123/80  Pulse: 73  Resp: 16  Height: 5\' 6"  (1.676 m)  Weight: 172 lb (78.019 kg)   Vitals:  WNL General appearance: alert, cooperative and no distress Head: Normocephalic, without obvious abnormality, atraumatic Eyes: negative Throat: lips, mucosa, and tongue normal; teeth and gums normal Lungs: clear to auscultation bilaterally Breasts: normal appearance, no masses or tenderness, No nipple retraction or dimpling, No nipple discharge or bleeding Heart: regular rate and rhythm Abdomen: soft, non-tender; bowel sounds normal; no masses,  no  organomegaly  Pelvic:  External Genitalia:  Tanner V, no lesion Urethra:  No prolpase Vagina:  Pink, normal rugae, no blood or discharge Cervix:  No CMT, no lesion Uterus:  Normal size and contour, non tender Adnexa:  Normal, no masses, non tender  Extremities: no edema, redness or tenderness in the calves or thighs Skin: small macular area in inner left wrist. Lymph nodes: Axillary adenopathy: none        Assessment:    Healthy female exam.    Plan:    Education reviewed: safe sex/STD prevention and self breast exams. Contraception: OCP (estrogen/progesterone). Follow up in: 1 year.    Patient desires gonorrhea and chlamydia testing today EpiPen refilled Triamcinolone cream for rash, see history of present illness for further details.

## 2015-01-16 LAB — GC/CHLAMYDIA PROBE AMP
CT PROBE, AMP APTIMA: NEGATIVE
GC Probe RNA: NEGATIVE

## 2015-12-17 ENCOUNTER — Other Ambulatory Visit: Payer: Self-pay | Admitting: Obstetrics & Gynecology

## 2016-04-12 ENCOUNTER — Other Ambulatory Visit: Payer: Self-pay | Admitting: Obstetrics & Gynecology

## 2016-04-12 DIAGNOSIS — Z91030 Bee allergy status: Secondary | ICD-10-CM

## 2019-01-22 ENCOUNTER — Emergency Department (HOSPITAL_COMMUNITY)
Admission: EM | Admit: 2019-01-22 | Discharge: 2019-01-22 | Disposition: A | Discharge status: Home / Self Care | Payer: No Typology Code available for payment source | Attending: Emergency Medicine | Admitting: Emergency Medicine

## 2019-01-22 ENCOUNTER — Encounter (HOSPITAL_COMMUNITY): Payer: Self-pay | Admitting: Emergency Medicine

## 2019-01-22 ENCOUNTER — Other Ambulatory Visit: Payer: Self-pay

## 2019-01-22 ENCOUNTER — Emergency Department (HOSPITAL_COMMUNITY): Payer: No Typology Code available for payment source

## 2019-01-22 DIAGNOSIS — S6991XA Unspecified injury of right wrist, hand and finger(s), initial encounter: Secondary | ICD-10-CM | POA: Diagnosis present

## 2019-01-22 DIAGNOSIS — Y929 Unspecified place or not applicable: Secondary | ICD-10-CM | POA: Diagnosis not present

## 2019-01-22 DIAGNOSIS — I10 Essential (primary) hypertension: Secondary | ICD-10-CM | POA: Insufficient documentation

## 2019-01-22 DIAGNOSIS — Y999 Unspecified external cause status: Secondary | ICD-10-CM | POA: Diagnosis not present

## 2019-01-22 DIAGNOSIS — Y939 Activity, unspecified: Secondary | ICD-10-CM | POA: Insufficient documentation

## 2019-01-22 DIAGNOSIS — S62666A Nondisplaced fracture of distal phalanx of right little finger, initial encounter for closed fracture: Secondary | ICD-10-CM | POA: Diagnosis not present

## 2019-01-22 DIAGNOSIS — Z87891 Personal history of nicotine dependence: Secondary | ICD-10-CM | POA: Diagnosis not present

## 2019-01-22 MED ORDER — HYDROCODONE-ACETAMINOPHEN 5-325 MG PO TABS: 1.0000 | ORAL_TABLET | ORAL | 0 refills | Status: AC | PRN

## 2019-01-22 MED ORDER — HYDROCODONE-ACETAMINOPHEN 5-325 MG PO TABS
1.0000 | ORAL_TABLET | Freq: Once | ORAL | Status: AC
  Administered 2019-01-22: 22:00:00 1 via ORAL
  Filled 2019-01-22: qty 1
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
## 2019-01-22 NOTE — Discharge Instructions (Addendum)
You were evaluated in the Emergency Department and after careful evaluation, we did not find any emergent condition requiring admission or further testing in the hospital.  Your symptoms today seem to be due to a broken bone in the pinky finger.  Please maintain the splint until you follow-up with the orthopedic specialists provided.  We recommend Tylenol or ibuprofen for pain.  For more significant pain or pain keeping you from sleeping at night, you can use the Norco medication provided.  Please return to the Emergency Department if you experience any worsening of your condition.  We encourage you to follow up with a primary care provider.  Thank you for allowing [place] to be a part of your care.
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
## 2019-08-12 ENCOUNTER — Ambulatory Visit: Payer: BC Managed Care – PPO | Attending: Internal Medicine

## 2020-02-27 ENCOUNTER — Other Ambulatory Visit: Payer: Self-pay | Admitting: Family Medicine

## 2020-02-27 DIAGNOSIS — Z1231 Encounter for screening mammogram for malignant neoplasm of breast: Secondary | ICD-10-CM

## 2020-03-18 ENCOUNTER — Other Ambulatory Visit: Payer: Self-pay

## 2020-03-18 ENCOUNTER — Ambulatory Visit
Admission: RE | Admit: 2020-03-18 | Discharge: 2020-03-18 | Disposition: A | Discharge status: Home / Self Care | Payer: BC Managed Care – PPO | Source: Ambulatory Visit | Attending: Family Medicine | Admitting: Family Medicine

## 2020-03-18 DIAGNOSIS — Z1231 Encounter for screening mammogram for malignant neoplasm of breast: Secondary | ICD-10-CM

## 2020-03-19 ENCOUNTER — Ambulatory Visit: Payer: BC Managed Care – PPO

## 2021-04-27 ENCOUNTER — Other Ambulatory Visit: Payer: Self-pay | Admitting: Anesthesiology

## 2021-04-27 ENCOUNTER — Other Ambulatory Visit: Payer: Self-pay | Admitting: Nurse Practitioner

## 2021-04-27 ENCOUNTER — Other Ambulatory Visit: Payer: Self-pay | Admitting: Family Medicine

## 2021-04-27 DIAGNOSIS — Z1231 Encounter for screening mammogram for malignant neoplasm of breast: Secondary | ICD-10-CM

## 2021-06-03 ENCOUNTER — Ambulatory Visit
Admission: RE | Admit: 2021-06-03 | Discharge: 2021-06-03 | Disposition: A | Discharge status: Home / Self Care | Payer: BC Managed Care – PPO | Source: Ambulatory Visit | Attending: Nurse Practitioner | Admitting: Nurse Practitioner

## 2021-06-03 DIAGNOSIS — Z1231 Encounter for screening mammogram for malignant neoplasm of breast: Secondary | ICD-10-CM

## 2022-08-29 ENCOUNTER — Other Ambulatory Visit: Payer: Self-pay | Admitting: Nurse Practitioner

## 2022-08-29 DIAGNOSIS — Z1231 Encounter for screening mammogram for malignant neoplasm of breast: Secondary | ICD-10-CM

## 2022-08-31 ENCOUNTER — Ambulatory Visit
Admission: RE | Admit: 2022-08-31 | Discharge: 2022-08-31 | Disposition: A | Discharge status: Home / Self Care | Payer: BC Managed Care – PPO | Source: Ambulatory Visit | Attending: Nurse Practitioner | Admitting: Nurse Practitioner

## 2022-08-31 DIAGNOSIS — Z1231 Encounter for screening mammogram for malignant neoplasm of breast: Secondary | ICD-10-CM

## 2022-09-15 ENCOUNTER — Encounter (HOSPITAL_COMMUNITY): Payer: Self-pay

## 2022-09-15 ENCOUNTER — Emergency Department (HOSPITAL_COMMUNITY)
Admission: EM | Admit: 2022-09-15 | Discharge: 2022-09-15 | Disposition: A | Discharge status: Home / Self Care | Payer: BC Managed Care – PPO | Attending: Emergency Medicine | Admitting: Emergency Medicine

## 2022-09-15 ENCOUNTER — Other Ambulatory Visit: Payer: Self-pay

## 2022-09-15 DIAGNOSIS — S4991XA Unspecified injury of right shoulder and upper arm, initial encounter: Secondary | ICD-10-CM | POA: Diagnosis present

## 2022-09-15 DIAGNOSIS — Y92410 Unspecified street and highway as the place of occurrence of the external cause: Secondary | ICD-10-CM | POA: Insufficient documentation

## 2022-09-15 DIAGNOSIS — S46911A Strain of unspecified muscle, fascia and tendon at shoulder and upper arm level, right arm, initial encounter: Secondary | ICD-10-CM | POA: Diagnosis not present

## 2022-09-15 MED ORDER — DICLOFENAC SODIUM 75 MG PO TBEC: 75.0000 mg | DELAYED_RELEASE_TABLET | Freq: Two times a day (BID) | ORAL | 0 refills | Status: AC

## 2022-09-15 MED ORDER — METHOCARBAMOL 500 MG PO TABS: 500.0000 mg | ORAL_TABLET | Freq: Four times a day (QID) | ORAL | 0 refills | Status: AC
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
## 2023-09-29 IMAGING — MG MM DIGITAL SCREENING BILAT W/ TOMO AND CAD
8 series · 8 of 24 positions shown · non-contrast
Comparison: Previous exam(s).

CLINICAL DATA: Screening.

EXAM:
DIGITAL SCREENING BILATERAL MAMMOGRAM WITH TOMOSYNTHESIS AND CAD
TECHNIQUE: Bilateral screening digital craniocaudal and mediolateral oblique
mammograms were obtained. Bilateral screening digital breast
tomosynthesis was performed. The images were evaluated with
computer-aided detection.

[R CC synth-2D]
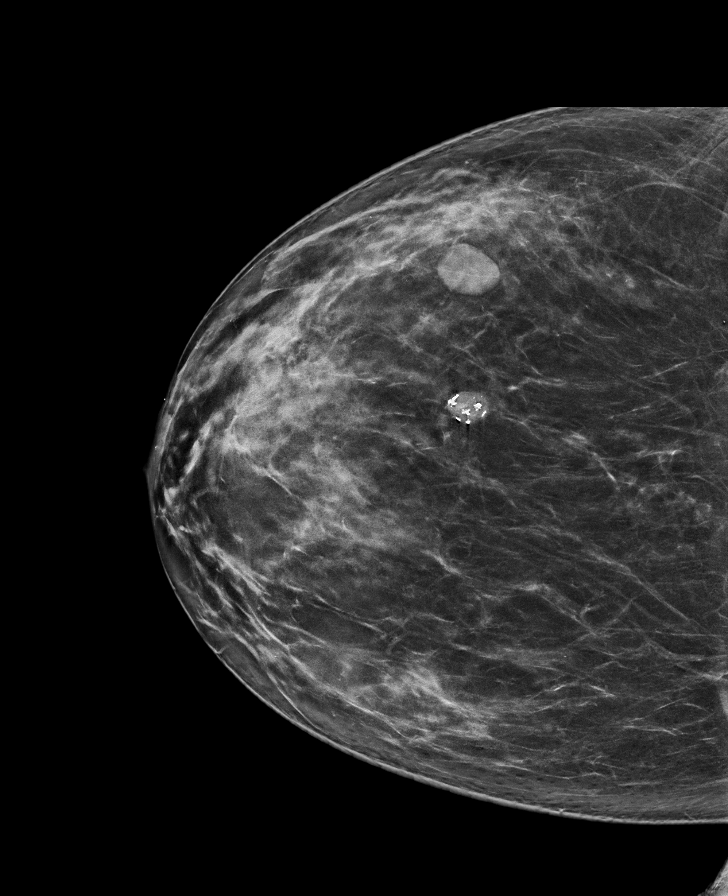

[R MLO synth-2D]
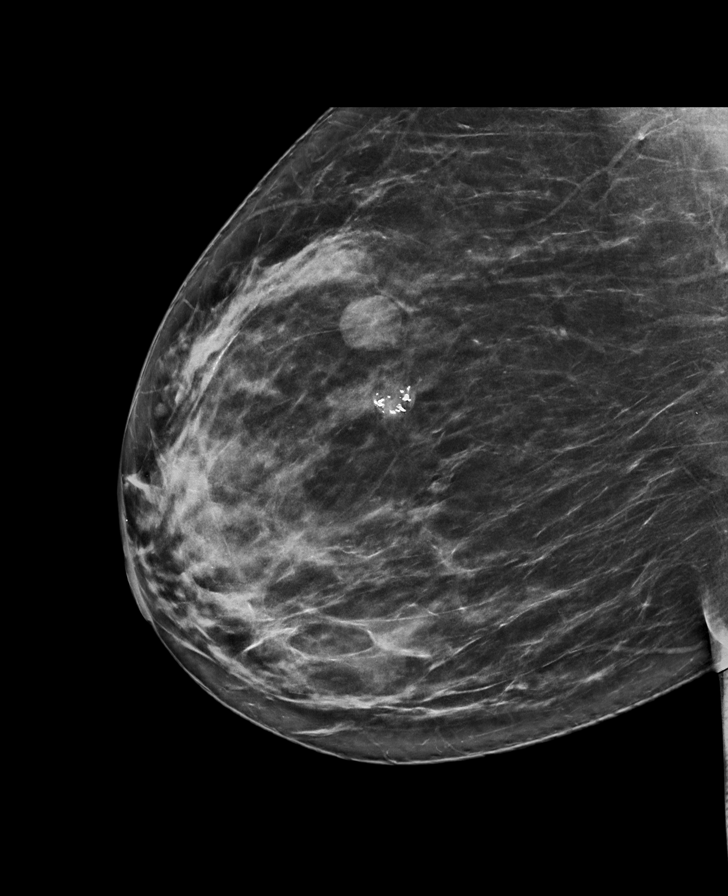

[L MLO synth-2D]
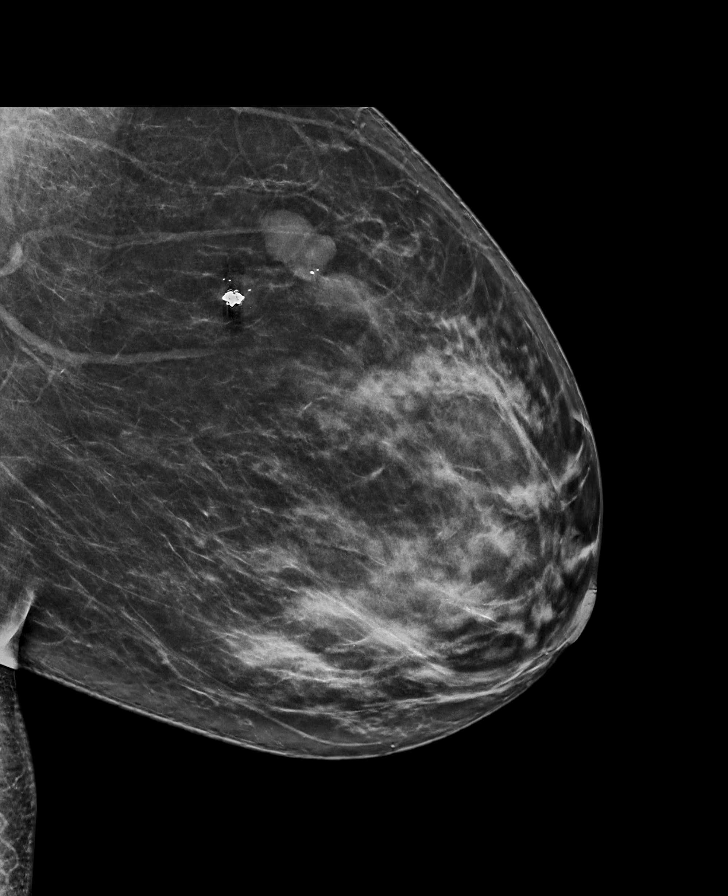

[L CC synth-2D]
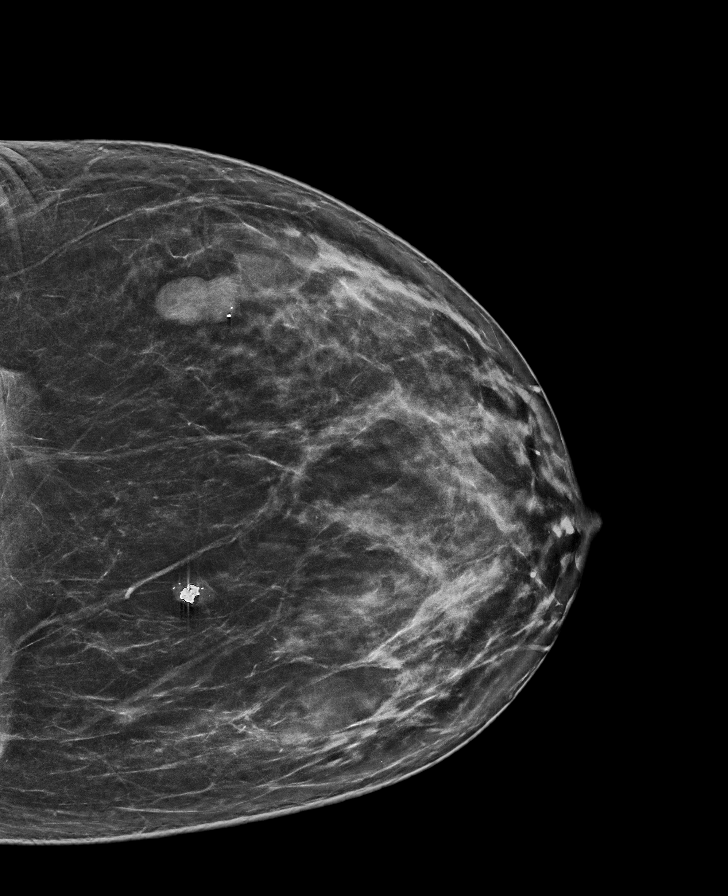

[L CC tomo · tomo slice 41/80.0]
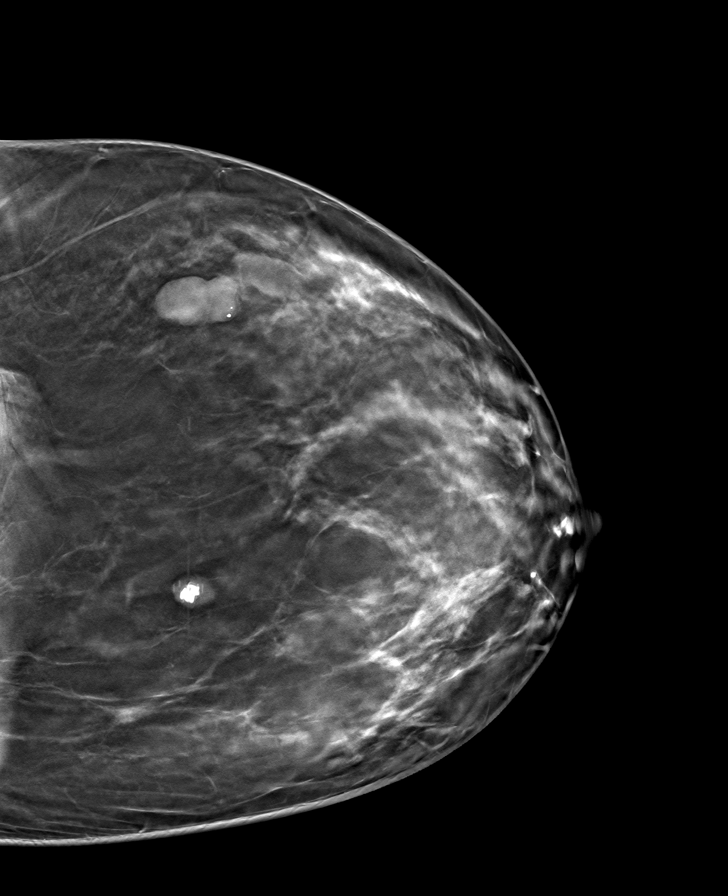

[R MLO tomo · tomo slice 41/82.0]
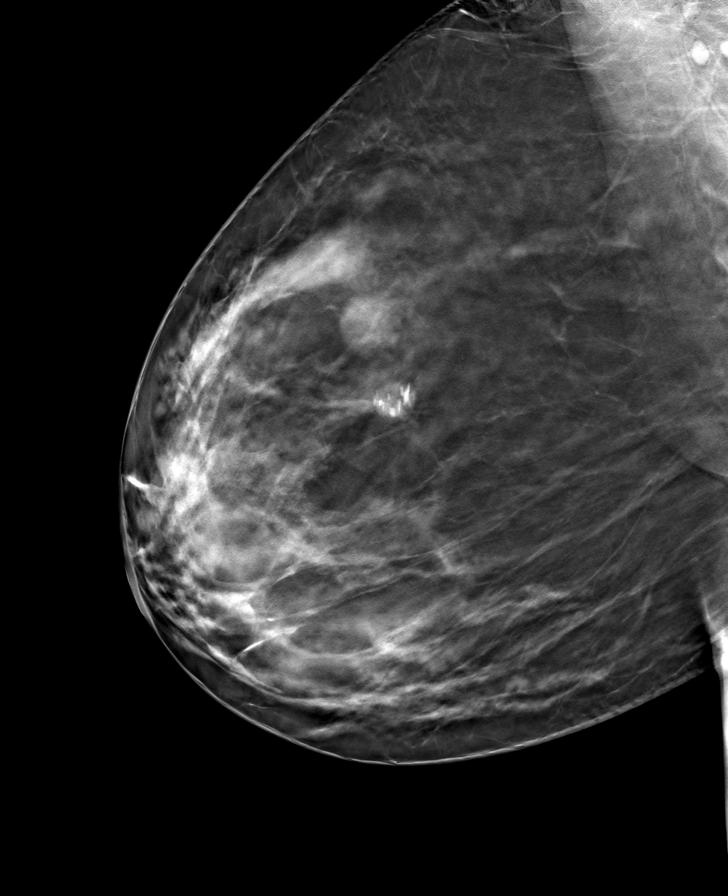

[L MLO tomo · tomo slice 40/79.0]
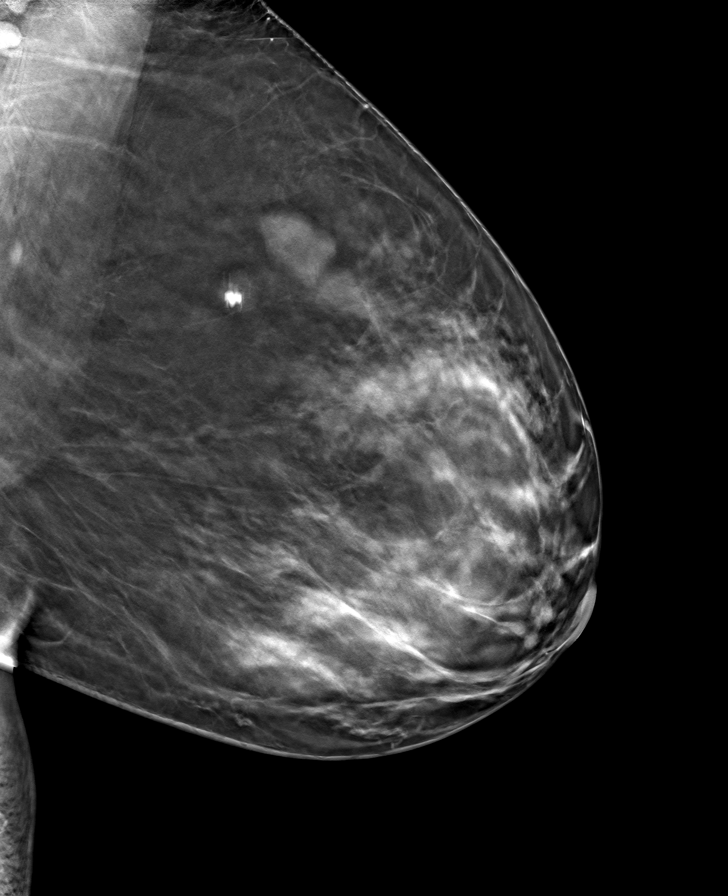

[R CC tomo · tomo slice 41/82.0]
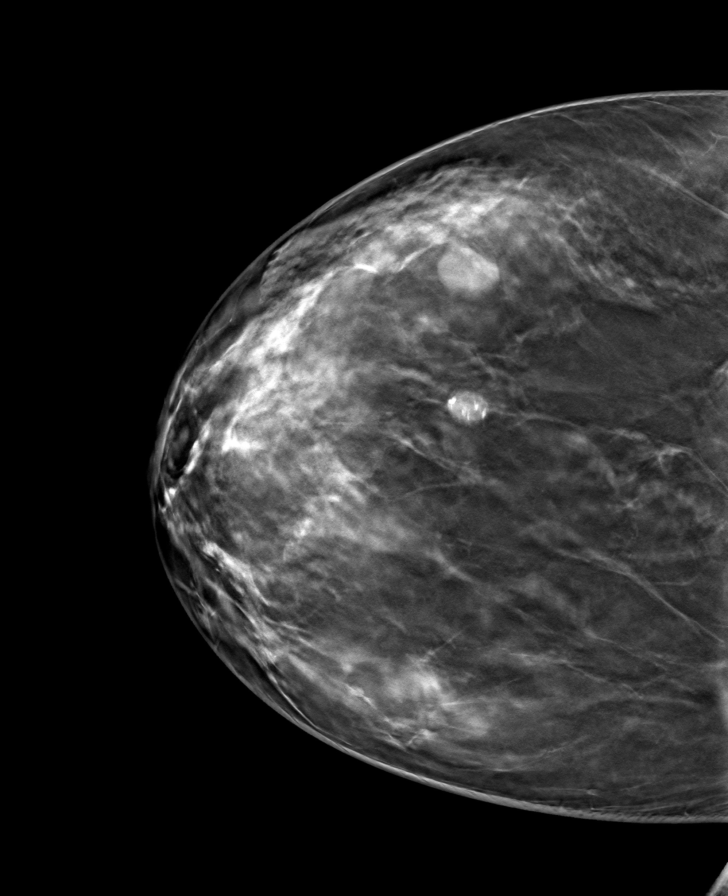

[8 of 24 positions shown; findings below may reference images not displayed]

ACR Breast Density Category c: The breast tissue is heterogeneously
dense, which may obscure small masses.
FINDINGS: There are no findings suspicious for malignancy.
IMPRESSION: No mammographic evidence of malignancy. A result letter of this
screening mammogram will be mailed directly to the patient.

RECOMMENDATION:
Screening mammogram in one year. (Code:Q3-W-BC3)

BI-RADS CATEGORY  1: Negative.

## 2023-10-24 ENCOUNTER — Other Ambulatory Visit: Payer: Self-pay

## 2023-10-24 DIAGNOSIS — Z1231 Encounter for screening mammogram for malignant neoplasm of breast: Secondary | ICD-10-CM

## 2023-11-08 ENCOUNTER — Ambulatory Visit
Admission: RE | Admit: 2023-11-08 | Discharge: 2023-11-08 | Disposition: A | Discharge status: Home / Self Care | Payer: Self-pay | Source: Ambulatory Visit

## 2023-11-08 DIAGNOSIS — Z1231 Encounter for screening mammogram for malignant neoplasm of breast: Secondary | ICD-10-CM
